# Patient Record
Sex: Female | Born: 1975
Health system: Southern US, Community
[De-identification: ages and names within clinical notes are randomized; demographics above are authoritative.]

## PROBLEM LIST (undated history)

## (undated) DIAGNOSIS — D219 Benign neoplasm of connective and other soft tissue, unspecified: Secondary | ICD-10-CM

## (undated) HISTORY — DX: Benign neoplasm of connective and other soft tissue, unspecified: D21.9

---

## 2015-08-02 ENCOUNTER — Ambulatory Visit (INDEPENDENT_AMBULATORY_CARE_PROVIDER_SITE_OTHER): Payer: 59 | Admitting: Obstetrics and Gynecology

## 2015-08-02 ENCOUNTER — Encounter: Payer: Self-pay | Admitting: Obstetrics and Gynecology

## 2015-08-02 VITALS — BP 96/58 | HR 80 | Resp 16 | Ht 61.5 in | Wt 134.0 lb

## 2015-08-02 DIAGNOSIS — R938 Abnormal findings on diagnostic imaging of other specified body structures: Secondary | ICD-10-CM | POA: Diagnosis not present

## 2015-08-02 DIAGNOSIS — N888 Other specified noninflammatory disorders of cervix uteri: Secondary | ICD-10-CM

## 2015-08-02 DIAGNOSIS — R9389 Abnormal findings on diagnostic imaging of other specified body structures: Secondary | ICD-10-CM

## 2015-08-02 DIAGNOSIS — N898 Other specified noninflammatory disorders of vagina: Secondary | ICD-10-CM | POA: Diagnosis not present

## 2015-08-02 DIAGNOSIS — R1909 Other intra-abdominal and pelvic swelling, mass and lump: Secondary | ICD-10-CM

## 2015-08-02 NOTE — Progress Notes (Signed)
Patient ID: Christine Mahoney, female   DOB: Jul 26, 1976, 39 y.o.   MRN: [id number] GYNECOLOGY  VISIT   HPI: 39 y.o.   Married  [place]  female   [phone] with Patient's last menstrual period was 07/08/2015.   here for for a follow up of a vaginal cyst the her former GYN found in 01/2015. The patient was incidentally noted to have a large vaginal cyst and an endocervical polyp in the spring of this year. She also had an ultrasound that showed a 1.6 cm endometrial stripe. Surgery was recommended. She moved and is getting f/u care here.  Menses q month over the last 10 months, previously about every 6 weeks. She bleeds x 7 days. 2-3 days are heavy. She saturates a super + tampon in 2-3 hours. Never anemic.  Sexually active, no pain, using condoms.   GYNECOLOGIC HISTORY: Patient's last menstrual period was 07/08/2015. Contraception:None Menopausal hormone therapy: N/A        OB History    Gravida Para Term Preterm AB TAB SAB Ectopic Multiple Living   2 2 2       2          There are no active problems to display for this patient.   Past Medical History  Diagnosis Date  . Fibroid     Past Surgical History  Procedure Laterality Date  . Cesarean section      Current Outpatient Prescriptions  Medication Sig Dispense Refill  . Multiple Vitamin (MULTIVITAMIN) capsule Take 1 capsule by mouth daily.     No current facility-administered medications for this visit.    SH: husband is an Materials engineer with [employer]. She runs a business. Sons are 3 and 5.  ALLERGIES: Azithromycin  History reviewed. No pertinent family history.  Social History   Social History  . Marital Status: Married    Spouse Name: N/A  . Number of Children: N/A  . Years of Education: N/A   Occupational History  . Not on file.   Social History Main Topics  . Smoking status: Never Smoker   . Smokeless tobacco: Never Used  . Alcohol Use: 0.6 oz/week    1 Standard drinks or equivalent per week  . Drug Use: No  .  Sexual Activity:    Partners: Male   Other Topics Concern  . Not on file   Social History Narrative  . No narrative on file    Review of Systems  Gastrointestinal: Negative for abdominal pain.  All other systems reviewed and are negative.   PHYSICAL EXAMINATION:    BP 96/58 mmHg  Pulse 80  Resp 16  Ht 5' 1.5" (1.562 m)  Wt 134 lb (60.782 kg)  BMI 24.91 kg/m2  LMP 07/08/2015    General appearance: alert, cooperative and appears stated age Neck: no adenopathy, supple, symmetrical, trachea midline and thyroid normal to inspection and palpation Abdomen: soft, non-tender; bowel sounds normal; no masses,  no organomegaly  Pelvic: External genitalia:  no lesions              Urethra:  normal appearing urethra with no masses, tenderness or lesions              Bartholins and Skenes: normal                 Vagina: normal appearing vagina with a 3 cm vaginal cyst in the apical portion of the left side of the vagina. Not tender.  Cervix: 1.5 cm protruding cervical polyp. Removed with ringed forceps. Upon grasping it decompressed some and on removal looked like a multicystic nabothian cyst.               Bimanual Exam:  Uterus:  slightly enlarged, anteverted, mobile, not tender uterus.               Adnexa: no mass, fullness, tenderness               Chaperone was present for exam.  ASSESSMENT Cervical mass Vaginal cyst, physiologic, not bothersome. No need for removal Heavy vaginal bleeding, not anemic Endometrial thickening    PLAN Cervical mass removed F/U for an ultrasound after her menses and possible sonohysterogram depending on her endometrial stripe   An After Visit Summary was printed and given to the patient.

## 2015-08-02 NOTE — Addendum Note (Signed)
Addended by: Dorothy Spark on: 08/02/2015 04:59 PM   Modules accepted: Orders

## 2015-08-07 ENCOUNTER — Telehealth: Payer: Self-pay | Admitting: Obstetrics and Gynecology

## 2015-08-07 NOTE — Telephone Encounter (Signed)
Spoke with patient regarding benefit for pelvic ultrasound. Patient agreeable and ready to schedule. Patient scheduled for 08/15/15 at 1230 with dr Talbert Nan. Patient has concerns regarding her cycle and the scheduled date of her pelvic ultrasound. Patient aware of cancellation policy and of modified holiday hours in office this week. Patient asking to speak with nurse for recommendations on scheduling. Patient also requesting recent biopsy/lab results. Patient agreeable to return call from nurse to discuss.

## 2015-08-07 NOTE — Telephone Encounter (Signed)
Please change her appointment to the end of her cycle.

## 2015-08-07 NOTE — Telephone Encounter (Signed)
Spoke with patient. Patient is concerned she will be on her cycle at the time she is scheduled for her PUS on 11/29. Advised it is okay if she is on her cycle the day of the ultrasound. We are able to perform ultrasounds while patients are on their cycle. If she feels uncomfortable having this done with her cycle I can reschedule. Patient feels she will be okay to keep the appointment as it is scheduled. Advised patient that her recent biopsy results have not returned yet, but as soon as they do and are reviewed by Dr.Jertson we will give her a call to discuss the results. Patient is agreeable.   Routing to provider for final review. Patient agreeable to disposition. Will close encounter.

## 2015-08-07 NOTE — Telephone Encounter (Signed)
Spoke with patient. Advised of message as seen below from [place]. Patient is agreeable and verbalizes understanding. Appointment moved to 08/17/2015 at 9:30 am with 10 am consult with Dr.Jertson. Patient is agreeable to date and time. Will return call Monday if her cycle is ending to move her appointment back to 11/29.  Routing to Dr.Jertson as FYI. Encounter previously closed.

## 2015-08-08 ENCOUNTER — Telehealth: Payer: Self-pay

## 2015-08-08 NOTE — Telephone Encounter (Signed)
Spoke with patient. Results given as seen below from [place]. Patient is agreeable and verbalizes understanding.  Routing to provider for final review. Patient agreeable to disposition. Will close encounter.

## 2015-08-08 NOTE — Telephone Encounter (Signed)
-----   Message from Salvadore Dom, MD sent at 08/07/2015  3:53 PM EST ----- Please inform the patient that it returned as a benign endocervical polyp.

## 2015-08-14 ENCOUNTER — Telehealth: Payer: Self-pay | Admitting: Obstetrics and Gynecology

## 2015-08-14 NOTE — Telephone Encounter (Signed)
Patient has a question about her period ending and the ultrasound appointment scheduled on Thursday.

## 2015-08-14 NOTE — Telephone Encounter (Signed)
Spoke with patient. Advised of message as seen below from [place]. Patient is agreeable. Will keep appointment for 08/17/2015.  Routing to provider for final review. Patient agreeable to disposition. Will close encounter.

## 2015-08-14 NOTE — Telephone Encounter (Signed)
Spoke with patient. Patient states that she feels her cycle is about to end. Cycle started on 11/24. "I know the ultrasound needs to be done at the end of my cycle. I want to make sure I get the best results." Patient states that her bleeding is light today and feels it will be very light tomorrow. Offered to move PUS to tomorrow 11/29. Patient is unavailable at the times offered. Patient prefers to keep her PUS for Thursday. Advised this should be okay as she feels her cycle will end tomorrow or Wednesday. Advised I will check with Dr.Jertson and return call. Patient is agreeable.  Dr.Jertson, Okay to keep as scheduled?

## 2015-08-14 NOTE — Telephone Encounter (Signed)
If she is ending her cycle now, Thursday should be fine. Thanks

## 2015-08-15 ENCOUNTER — Encounter: Payer: Self-pay | Admitting: Obstetrics and Gynecology

## 2015-08-15 ENCOUNTER — Other Ambulatory Visit: Payer: 59 | Admitting: Obstetrics and Gynecology

## 2015-08-15 ENCOUNTER — Other Ambulatory Visit: Payer: 59

## 2015-08-17 ENCOUNTER — Encounter: Payer: Self-pay | Admitting: Obstetrics and Gynecology

## 2015-08-17 ENCOUNTER — Ambulatory Visit (INDEPENDENT_AMBULATORY_CARE_PROVIDER_SITE_OTHER): Payer: 59

## 2015-08-17 ENCOUNTER — Ambulatory Visit (INDEPENDENT_AMBULATORY_CARE_PROVIDER_SITE_OTHER): Payer: 59 | Admitting: Obstetrics and Gynecology

## 2015-08-17 VITALS — BP 98/60 | HR 64 | Resp 14 | Wt 133.0 lb

## 2015-08-17 DIAGNOSIS — R938 Abnormal findings on diagnostic imaging of other specified body structures: Secondary | ICD-10-CM | POA: Diagnosis not present

## 2015-08-17 DIAGNOSIS — R9389 Abnormal findings on diagnostic imaging of other specified body structures: Secondary | ICD-10-CM

## 2015-08-17 NOTE — Progress Notes (Signed)
       GYNECOLOGY  VISIT   HPI: 39 y.o.   Married  [place]  female   [phone] with Patient's last menstrual period was 08/10/2015.   here for Pelvic U/S. [place] she was told her endometrial lining was very thick and she needed a hysteroscopy. She reports normal cycles. Today she presents for an ultrasound to evaluate her lining after her cycle.  At her last visit a large benign cervical polyp was removed.   GYNECOLOGIC HISTORY: Patient's last menstrual period was 08/10/2015. Contraception:none Menopausal hormone therapy: N/A        OB History    Gravida Para Term Preterm AB TAB SAB Ectopic Multiple Living   2 2 2       2          There are no active problems to display for this patient.   Past Medical History  Diagnosis Date  . Fibroid     Past Surgical History  Procedure Laterality Date  . Cesarean section      Current Outpatient Prescriptions  Medication Sig Dispense Refill  . Multiple Vitamin (MULTIVITAMIN) capsule Take 1 capsule by mouth daily.     No current facility-administered medications for this visit.     ALLERGIES: Azithromycin  History reviewed. No pertinent family history.  Social History   Social History  . Marital Status: Married    Spouse Name: N/A  . Number of Children: N/A  . Years of Education: N/A   Occupational History  . Not on file.   Social History Main Topics  . Smoking status: Never Smoker   . Smokeless tobacco: Never Used  . Alcohol Use: 0.6 oz/week    1 Standard drinks or equivalent per week  . Drug Use: No  . Sexual Activity:    Partners: Male   Other Topics Concern  . Not on file   Social History Narrative    Review of Systems  All other systems reviewed and are negative.   PHYSICAL EXAMINATION:    BP 98/60 mmHg  Pulse 64  Resp 14  Wt 133 lb (60.328 kg)  LMP 08/10/2015    General appearance: alert, cooperative and appears stated age  Ultrasound pictures were reviewed with the  patient, her lining is completely normal on today's ultrasound. Few small myomas noted.   ASSESSMENT Normal appearing endometrium    PLAN F/U for routine care   An After Visit Summary was printed and given to the patient.

## 2015-10-06 DIAGNOSIS — M545 Low back pain: Secondary | ICD-10-CM | POA: Diagnosis not present

## 2015-10-10 DIAGNOSIS — M545 Low back pain: Secondary | ICD-10-CM | POA: Diagnosis not present

## 2015-10-12 DIAGNOSIS — M545 Low back pain: Secondary | ICD-10-CM | POA: Diagnosis not present

## 2015-10-17 DIAGNOSIS — M545 Low back pain: Secondary | ICD-10-CM | POA: Diagnosis not present

## 2016-01-22 DIAGNOSIS — M533 Sacrococcygeal disorders, not elsewhere classified: Secondary | ICD-10-CM | POA: Diagnosis not present

## 2016-01-26 DIAGNOSIS — M533 Sacrococcygeal disorders, not elsewhere classified: Secondary | ICD-10-CM | POA: Diagnosis not present

## 2016-02-06 DIAGNOSIS — M533 Sacrococcygeal disorders, not elsewhere classified: Secondary | ICD-10-CM | POA: Diagnosis not present

## 2016-02-20 DIAGNOSIS — M533 Sacrococcygeal disorders, not elsewhere classified: Secondary | ICD-10-CM | POA: Diagnosis not present

## 2016-02-26 DIAGNOSIS — M9903 Segmental and somatic dysfunction of lumbar region: Secondary | ICD-10-CM | POA: Diagnosis not present

## 2016-02-26 DIAGNOSIS — M9902 Segmental and somatic dysfunction of thoracic region: Secondary | ICD-10-CM | POA: Diagnosis not present

## 2016-11-12 ENCOUNTER — Telehealth: Payer: Self-pay | Admitting: Obstetrics and Gynecology

## 2016-11-20 ENCOUNTER — Encounter: Payer: Self-pay | Admitting: Obstetrics and Gynecology

## 2016-11-20 ENCOUNTER — Ambulatory Visit (INDEPENDENT_AMBULATORY_CARE_PROVIDER_SITE_OTHER): Payer: 59 | Admitting: Obstetrics and Gynecology

## 2016-11-20 VITALS — BP 92/60 | HR 80 | Resp 16 | Ht 61.25 in | Wt 140.0 lb

## 2016-11-20 DIAGNOSIS — N898 Other specified noninflammatory disorders of vagina: Secondary | ICD-10-CM | POA: Diagnosis not present

## 2016-11-20 DIAGNOSIS — Z01419 Encounter for gynecological examination (general) (routine) without abnormal findings: Secondary | ICD-10-CM | POA: Diagnosis not present

## 2016-11-20 DIAGNOSIS — N920 Excessive and frequent menstruation with regular cycle: Secondary | ICD-10-CM

## 2016-11-20 DIAGNOSIS — Z124 Encounter for screening for malignant neoplasm of cervix: Secondary | ICD-10-CM

## 2016-11-20 DIAGNOSIS — R6882 Decreased libido: Secondary | ICD-10-CM

## 2016-11-20 DIAGNOSIS — N923 Ovulation bleeding: Secondary | ICD-10-CM

## 2016-11-20 DIAGNOSIS — Z1151 Encounter for screening for human papillomavirus (HPV): Secondary | ICD-10-CM | POA: Diagnosis not present

## 2016-11-20 DIAGNOSIS — Z0000 Encounter for general adult medical examination without abnormal findings: Secondary | ICD-10-CM

## 2016-11-20 LAB — COMPREHENSIVE METABOLIC PANEL
ALBUMIN: 4.4 g/dL (ref 3.6–5.1)
ALK PHOS: 79 U/L (ref 33–115)
ALT: 18 U/L (ref 6–29)
AST: 18 U/L (ref 10–30)
BUN: 14 mg/dL (ref 7–25)
CHLORIDE: 103 mmol/L (ref 98–110)
CO2: 27 mmol/L (ref 20–31)
Calcium: 9.7 mg/dL (ref 8.6–10.2)
Creat: 0.62 mg/dL (ref 0.50–1.10)
Glucose, Bld: 86 mg/dL (ref 65–99)
POTASSIUM: 4.6 mmol/L (ref 3.5–5.3)
Sodium: 138 mmol/L (ref 135–146)
TOTAL PROTEIN: 7.2 g/dL (ref 6.1–8.1)
Total Bilirubin: 0.3 mg/dL (ref 0.2–1.2)

## 2016-11-20 LAB — CBC
HEMATOCRIT: 40 % (ref 35.0–45.0)
HEMOGLOBIN: 13.2 g/dL (ref 11.7–15.5)
MCH: 29.3 pg (ref 27.0–33.0)
MCHC: 33 g/dL (ref 32.0–36.0)
MCV: 88.9 fL (ref 80.0–100.0)
MPV: 10 fL (ref 7.5–12.5)
Platelets: 349 10*3/uL (ref 140–400)
RBC: 4.5 MIL/uL (ref 3.80–5.10)
RDW: 12.9 % (ref 11.0–15.0)
WBC: 7.6 10*3/uL (ref 3.8–10.8)

## 2016-11-20 LAB — LIPID PANEL
CHOL/HDL RATIO: 3 ratio (ref <5.0)
CHOLESTEROL: 188 mg/dL (ref <200)
HDL: 63 mg/dL (ref >50)
LDL Cholesterol: 97 mg/dL (ref <100)
TRIGLYCERIDES: 142 mg/dL (ref <150)
VLDL: 28 mg/dL (ref <30)

## 2016-11-20 NOTE — Progress Notes (Signed)
41 y.o. G72P2002 Married [place] F here for annual exam.  For the last 4 months, she is having brown d/c mid cycle for about 4-5 days. No real blood. She notices the brown d/c with a thick mucous d/c.  Period Cycle (Days): 28 Period Duration (Days): 6-8 days  Period Pattern: Regular Menstrual Flow: Moderate, Heavy Menstrual Control: Maxi pad, Tampon Menstrual Control Change Freq (Hours): changes pad every 8 hours  Dysmenorrhea: None  Changing a pad in 3 hours at time. No other bleeding, no postcoital bleeding. No dyspareunia.  She c/o vaginal itching for the last week, mild. D/C started a few days ago, brown.  She c/o a low sex drive, they are both very busy.   Patient's last menstrual period was 11/03/2016.          Sexually active: Yes.    The current method of family planning is condoms most of the time.    Exercising: Yes.    Cardio/ light strength training  Smoker:  no  Health Maintenance: Pap: 02/2015 WNL per patient  History of abnormal Pap:  no MMG:  Never Colonoscopy:  Never BMD:   Never TDaP:  Unsure, thinks UTD Gardasil: N/A   reports that she has never smoked. She has never used smokeless tobacco. 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 They have 2 young sons (55 and 7).   Past Medical History:  Diagnosis Date  . Fibroid     Past Surgical History:  Procedure Laterality Date  . CESAREAN SECTION    x 2  Current Outpatient Prescriptions  Medication Sig Dispense Refill  . Multiple Vitamin (MULTIVITAMIN) capsule Take 1 capsule by mouth daily.     No current facility-administered medications for this visit.     No family history on file.  Review of Systems  Constitutional: Negative.   HENT: Negative.   Eyes: Negative.   Respiratory: Negative.   Cardiovascular: Negative.   Gastrointestinal: Negative.   Endocrine: Negative.   Genitourinary: Positive for vaginal discharge.       Loss of  sexual interest vaginal itching   Musculoskeletal: Negative.   Skin: Negative.   Allergic/Immunologic: Negative.   Neurological: Negative.   Psychiatric/Behavioral: Negative.     Exam:   BP 92/60 (BP Location: Right Arm, Patient Position: Sitting, Cuff Size: Normal)   Pulse 80   Resp 16   Ht 5' 1.25" (1.556 m)   Wt 140 lb (63.5 kg)   LMP 11/03/2016   BMI 26.24 kg/m   Weight change: @WEIGHTCHANGE @ Height:   Height: 5' 1.25" (155.6 cm)  Ht Readings from Last 3 Encounters:  11/20/16 5' 1.25" (1.556 m)  08/02/15 5' 1.5" (1.562 m)    General appearance: alert, cooperative and appears stated age Head: Normocephalic, without obvious abnormality, atraumatic Neck: no adenopathy, supple, symmetrical, trachea midline and thyroid normal to inspection and palpation Lungs: clear to auscultation bilaterally Cardiovascular: regular rate and rhythm Breasts: normal appearance, no masses or tenderness Heart: regular rate and rhythm Abdomen: soft, non-tender; bowel sounds normal; no masses,  no organomegaly Extremities: extremities normal, atraumatic, no cyanosis or edema Skin: Skin color, texture, turgor normal. No rashes or lesions Lymph nodes: Cervical, supraclavicular, and axillary nodes normal. No abnormal inguinal nodes palpated Neurologic: Grossly normal   Pelvic: External genitalia:  no lesions              Urethra:  normal appearing urethra with no masses, tenderness  or lesions              Bartholins and Skenes: normal                 Vagina: normal appearing vagina with normal color and a slight increase in thick white vaginal discharge, no lesions              Cervix: no lesions               Bimanual Exam:  Uterus:  normal size, contour, position, consistency, mobility, non-tender and anteverted              Adnexa: no mass, fullness, tenderness               Rectovaginal: Confirms               Anus:  normal sphincter tone, no lesions  Chaperone was present for exam.  A:   Well Woman with normal exam  Low libido  Mid cycle spotting  Vaginal discharge  P:   Pap with hpv  Screening labs   Discussed breast self exam  Discussed calcium and vit D intake  Mammogram  Discussed libido, information given  Calendar cycles, send in for review  Wet prep probe

## 2016-11-20 NOTE — Patient Instructions (Signed)
EXERCISE AND DIET:  We recommended that you start or continue a regular exercise program for good health. Regular exercise means any activity that makes your heart beat faster and makes you sweat.  We recommend exercising at least 30 minutes per day at least 3 days a week, preferably 4 or 5.  We also recommend a diet low in fat and sugar.  Inactivity, poor dietary choices and obesity can cause diabetes, heart attack, stroke, and kidney damage, among others.    ALCOHOL AND SMOKING:  Women should limit their alcohol intake to no more than 7 drinks/beers/glasses of wine (combined, not each!) per week. Moderation of alcohol intake to this level decreases your risk of breast cancer and liver damage. And of course, no recreational drugs are part of a healthy lifestyle.  And absolutely no smoking or even second hand smoke. Most people know smoking can cause heart and lung diseases, but did you know it also contributes to weakening of your bones? Aging of your skin?  Yellowing of your teeth and nails?  CALCIUM AND VITAMIN D:  Adequate intake of calcium and Vitamin D are recommended.  The recommendations for exact amounts of these supplements seem to change often, but generally speaking 600 mg of calcium (either carbonate or citrate) and 800 units of Vitamin D per day seems prudent. Certain women may benefit from higher intake of Vitamin D.  If you are among these women, your doctor will have told you during your visit.    PAP SMEARS:  Pap smears, to check for cervical cancer or precancers,  have traditionally been done yearly, although recent scientific advances have shown that most women can have pap smears less often.  However, every woman still should have a physical exam from her gynecologist every year. It will include a breast check, inspection of the vulva and vagina to check for abnormal growths or skin changes, a visual exam of the cervix, and then an exam to evaluate the size and shape of the uterus and  ovaries.  And after 40 years of age, a rectal exam is indicated to check for rectal cancers. We will also provide age appropriate advice regarding health maintenance, like when you should have certain vaccines, screening for sexually transmitted diseases, bone density testing, colonoscopy, mammograms, etc.   MAMMOGRAMS:  All women over 40 years old should have a yearly mammogram. Many facilities now offer a "3D" mammogram, which may cost around $50 extra out of pocket. If possible,  we recommend you accept the option to have the 3D mammogram performed.  It both reduces the number of women who will be called back for extra views which then turn out to be normal, and it is better than the routine mammogram at detecting truly abnormal areas.    COLONOSCOPY:  Colonoscopy to screen for colon cancer is recommended for all women at age 50.  We know, you hate the idea of the prep.  We agree, BUT, having colon cancer and not knowing it is worse!!  Colon cancer so often starts as a polyp that can be seen and removed at colonscopy, which can quite literally save your life!  And if your first colonoscopy is normal and you have no family history of colon cancer, most women don't have to have it again for 10 years.  Once every ten years, you can do something that may end up saving your life, right?  We will be happy to help you get it scheduled when you are ready.    Be sure to check your insurance coverage so you understand how much it will cost.  It may be covered as a preventative service at no cost, but you should check your particular policy.      Breast Self-Awareness Breast self-awareness means:  Knowing how your breasts look.  Knowing how your breasts feel.  Checking your breasts every month for changes.  Telling your doctor if you notice a change in your breasts. Breast self-awareness allows you to notice a breast problem early while it is still small. How to do a breast self-exam One way to learn what  is normal for your breasts and to check for changes is to do a breast self-exam. To do a breast self-exam: Look for Changes   1. Take off all the clothes above your waist. 2. Stand in front of a mirror in a room with good lighting. 3. Put your hands on your hips. 4. Push your hands down. 5. Look at your breasts and nipples in the mirror to see if one breast or nipple looks different than the other. Check to see if:  The shape of one breast is different.  The size of one breast is different.  There are wrinkles, dips, and bumps in one breast and not the other. 6. Look at each breast for changes in your skin, such as:  Redness.  Scaly areas. 7. Look for changes in your nipples, such as:  Liquid around the nipples.  Bleeding.  Dimpling.  Redness.  A change in where the nipples are. Feel for Changes  1. Lie on your back on the floor. 2. Feel each breast. To do this, follow these steps:  Pick a breast to feel.  Put the arm closest to that breast above your head.  Use your other arm to feel the nipple area of your breast. Feel the area with the pads of your three middle fingers by making small circles with your fingers. For the first circle, press lightly. For the second circle, press harder. For the third circle, press even harder.  Keep making circles with your fingers at the light, harder, and even harder pressures as you move down your breast. Stop when you feel your ribs.  Move your fingers a little toward the center of your body.  Start making circles with your fingers again, this time going up until you reach your collarbone.  Keep making up and down circles until you reach your armpit. Remember to keep using the three pressures.  Feel the other breast in the same way. 3. Sit or stand in the shower or tub. 4. With soapy water on your skin, feel each breast the same way you did in step 2, when you were lying on the floor. Write Down What You Find   After doing the  self-exam, write down:  What is normal for each breast.  Any changes you find in each breast.  When you last had your period. How often should I check my breasts? Check your breasts every month. If you are breastfeeding, the best time to check them is after you feed your baby or after you use a breast pump. If you get periods, the best time to check your breasts is 5-7 days after your period is over. When should I see my doctor? See your doctor if you notice:  A change in shape or size of your breasts or nipples.  A change in the skin of your breast or nipples, such as red or  scaly skin.  Unusual fluid coming from your nipples.  A lump or thick area that was not there before.  Pain in your breasts.  Anything that concerns you. This information is not intended to replace advice given to you by your health care provider. Make sure you discuss any questions you have with your health care provider. Document Released: 02/19/2008 Document Revised: 02/08/2016 Document Reviewed: 07/23/2015 Elsevier Interactive Patient Education  2017 [employer].

## 2016-11-21 LAB — VITAMIN D 25 HYDROXY (VIT D DEFICIENCY, FRACTURES): Vit D, 25-Hydroxy: 29 ng/mL — ABNORMAL LOW (ref 30–100)

## 2016-11-21 LAB — WET PREP BY MOLECULAR PROBE
Candida species: NOT DETECTED
Gardnerella vaginalis: NOT DETECTED
Trichomonas vaginosis: NOT DETECTED

## 2016-11-22 LAB — IPS PAP TEST WITH HPV

## 2016-12-18 NOTE — Telephone Encounter (Signed)
Ok to close

## 2016-12-26 ENCOUNTER — Ambulatory Visit (INDEPENDENT_AMBULATORY_CARE_PROVIDER_SITE_OTHER): Payer: 59 | Admitting: Physician Assistant

## 2016-12-26 ENCOUNTER — Encounter: Payer: Self-pay | Admitting: Physician Assistant

## 2016-12-26 DIAGNOSIS — N3090 Cystitis, unspecified without hematuria: Secondary | ICD-10-CM

## 2016-12-26 DIAGNOSIS — R300 Dysuria: Secondary | ICD-10-CM | POA: Diagnosis not present

## 2016-12-26 LAB — POC URINALSYSI DIPSTICK (AUTOMATED)
Bilirubin, UA: NEGATIVE
Blood, UA: NEGATIVE
Glucose, UA: NEGATIVE
Ketones, UA: 5
Nitrite, UA: NEGATIVE
PROTEIN UA: NEGATIVE
Spec Grav, UA: >=1.03 — AB (ref 1.010–1.025)
Urobilinogen, UA: 0.2 E.U./dL
pH, UA: 6 (ref 5.0–8.0)

## 2016-12-26 LAB — POCT URINE PREGNANCY: PREG TEST UR: NEGATIVE

## 2016-12-26 MED ORDER — NITROFURANTOIN MONOHYD MACRO 100 MG PO CAPS: 100.0000 mg | ORAL_CAPSULE | Freq: Two times a day (BID) | ORAL | 0 refills | Status: DC

## 2016-12-26 NOTE — Progress Notes (Signed)
Pre visit review using our clinic review tool, if applicable. No additional management support is needed unless otherwise documented below in the visit note. 

## 2016-12-26 NOTE — Addendum Note (Signed)
Addended by: Erlene Quan on: 12/26/2016 02:25 PM   Modules accepted: Level of Service

## 2016-12-26 NOTE — Progress Notes (Signed)
Christine Mahoney is a 40 y.o. female here for a new problem.  Jari Sportsman, CMA acted as a Education administrator for [employer], [place].  History of Present Illness:   Chief Complaint  Patient presents with  . [employer] Employee  . Dysuria    Sx have been intermittent x1 month now. Denies any past HX of UTIs. No urinary frequency , fevers, or abdominal pain.    Acute Concerns: Dysuria -- painful urination intermittently x 1 month, would self treat with cranberry pills, pees when she urinates, no history of urinary infection or kidney infection, had a kidney stone when she was in college, does okay with staying hydrated throughout, no fever/chills/nausea, no changes in bowels, March 20th was last period, they are very regular, no back or side pain, no concern for STI  Chronic Issues: None  Health Maintenance: Immunizations -- UTD Colonoscopy -- no family hx of colon cancer, start at age 32 Mammogram -- is considering to get this done PAP -- last PAP smear  Bone Density -- none Weight -- Weight: 141 lb 12.8 oz (64.3 kg)  Mood -- no issues with depression or anxiety  Depression screen PHQ 2/9 12/26/2016  Decreased Interest 0  Down, Depressed, Hopeless 0  PHQ - 2 Score 0    Other providers/specialists: [facility] - Ob-Gyn   PMHx, SurgHx, SocialHx, Medications, and Allergies were reviewed in the Visit Navigator and updated as appropriate.  Current Medications:   Current Outpatient Prescriptions:  Marland Kitchen  Multiple Vitamin (MULTIVITAMIN) capsule, Take 1 capsule by mouth daily., Disp: , Rfl:  .  nitrofurantoin, macrocrystal-monohydrate, (MACROBID) 100 MG capsule, Take 1 capsule (100 mg total) by mouth 2 (two) times daily., Disp: 10 capsule, Rfl: 0   Review of Systems:   Review of Systems  Constitutional: Negative.   HENT: Negative.   Eyes: Negative.   Respiratory: Negative.   Cardiovascular: Negative.   Genitourinary: Positive for dysuria and urgency.  Musculoskeletal:  Negative.   Skin: Negative.   Neurological: Negative.   Endo/Heme/Allergies: Negative.   Psychiatric/Behavioral: Negative.     Vitals:   Vitals:   12/26/16 1333  BP: 100/68  Pulse: 82  SpO2: 98%  Weight: 141 lb 12.8 oz (64.3 kg)  Height: 5' 1.25" (1.556 m)     Body mass index is 26.57 kg/m.  Physical Exam:   Physical Exam  Constitutional: She appears well-developed. She is cooperative.  Non-toxic appearance. She does not have a sickly appearance. She does not appear ill. No distress.  Cardiovascular: Normal rate, regular rhythm, S1 normal, S2 normal, normal heart sounds and normal pulses.   No LE edema  Pulmonary/Chest: Effort normal and breath sounds normal.  Abdominal: There is no tenderness. There is CVA tenderness.  No suprapubic tenderness  Neurological: She is alert.  Nursing note and vitals reviewed.   Results for orders placed or performed in visit on 12/26/16  POCT Urinalysis Dipstick (Automated)  Result Value Ref Range   Color, UA Yellow    Clarity, UA Clear    Glucose, UA Negative    Bilirubin, UA Negative    Ketones, UA 5    Spec Grav, UA >=1.030 (A) 1.010 - 1.025   Blood, UA Negative    pH, UA 6.0 5.0 - 8.0   Protein, UA Negative    Urobilinogen, UA 0.2 0.2 or 1.0 E.U./dL   Nitrite, UA Negative    Leukocytes, UA Trace (A) Negative  POCT urine pregnancy  Result Value Ref  Range   Preg Test, Ur Negative Negative     Assessment and Plan:    Semira was seen today for establish care and dysuria.  Diagnoses and all orders for this visit:  Dysuria -     POCT Urinalysis Dipstick (Automated) -     POCT urine pregnancy -     Urine culture; Future -     Urine culture  Other orders -     nitrofurantoin, macrocrystal-monohydrate, (MACROBID) 100 MG capsule; Take 1 capsule (100 mg total) by mouth 2 (two) times daily.   Given duration of symptoms, will treat acute cystitis with Macrobid 100 mg BID. Advised patient that if she develops any worsening  symptoms I would like for her to let us know, especially if she develops fevers, chills, poor appetite, or back pain. We will send her urine out for culture.  . Reviewed expectations re: course of current medical issues. . Discussed self-management of symptoms. . Outlined signs and symptoms indicating need for more acute intervention. . Patient verbalized understanding and all questions were answered. . See orders for this visit as documented in the electronic medical record. . Patient received an After-Visit Summary.  CMA or LPN served as scribe during this visit. History, Physical, and Plan performed by medical provider. Documentation and orders reviewed and attested to.  Inda Coke, PA-C

## 2016-12-26 NOTE — Patient Instructions (Addendum)
It was great meeting you today!  I have sent in [place] for you to take.  If you develop any worsening fever, chills or back pain, let us know.   Urinary Tract Infection, Adult A urinary tract infection (UTI) is an infection of any part of the urinary tract, which includes the kidneys, ureters, bladder, and urethra. These organs make, store, and get rid of urine in the body. UTI can be a bladder infection (cystitis) or kidney infection (pyelonephritis). What are the causes? This infection may be caused by fungi, viruses, or bacteria. Bacteria are the most common cause of UTIs. This condition can also be caused by repeated incomplete emptying of the bladder during urination. What increases the risk? This condition is more likely to develop if:  You ignore your need to urinate or hold urine for long periods of time.  You do not empty your bladder completely during urination.  You wipe back to front after urinating or having a bowel movement, if you are female.  You are uncircumcised, if you are female.  You are constipated.  You have a urinary catheter that stays in place (indwelling).  You have a weak defense (immune) system.  You have a medical condition that affects your bowels, kidneys, or bladder.  You have diabetes.  You take antibiotic medicines frequently or for long periods of time, and the antibiotics no longer work well against certain types of infections (antibiotic resistance).  You take medicines that irritate your urinary tract.  You are exposed to chemicals that irritate your urinary tract.  You are female. What are the signs or symptoms? Symptoms of this condition include:  Fever.  Frequent urination or passing small amounts of urine frequently.  Needing to urinate urgently.  Pain or burning with urination.  Urine that smells bad or unusual.  Cloudy urine.  Pain in the lower abdomen or back.  Trouble urinating.  Blood in the urine.  Vomiting  or being less hungry than normal.  Diarrhea or abdominal pain.  Vaginal discharge, if you are female. How is this diagnosed? This condition is diagnosed with a medical history and physical exam. You will also need to provide a urine sample to test your urine. Other tests may be done, including:  Blood tests.  Sexually transmitted disease (STD) testing. If you have had more than one UTI, a cystoscopy or imaging studies may be done to determine the cause of the infections. How is this treated? Treatment for this condition often includes a combination of two or more of the following:  Antibiotic medicine.  Other medicines to treat less common causes of UTI.  Over-the-counter medicines to treat pain.  Drinking enough water to stay hydrated. Follow these instructions at home:  Take over-the-counter and prescription medicines only as told by your health care provider.  If you were prescribed an antibiotic, take it as told by your health care provider. Do not stop taking the antibiotic even if you start to feel better.  Avoid alcohol, caffeine, tea, and carbonated beverages. They can irritate your bladder.  Drink enough fluid to keep your urine clear or pale yellow.  Keep all follow-up visits as told by your health care provider. This is important.  Make sure to:  Empty your bladder often and completely. Do not hold urine for long periods of time.  Empty your bladder before and after sex.  Wipe from front to back after a bowel movement if you are female. Use each tissue one time when you  wipe. Contact a health care provider if:  You have back pain.  You have a fever.  You feel nauseous or vomit.  Your symptoms do not get better after 3 days.  Your symptoms go away and then return. Get help right away if:  You have severe back pain or lower abdominal pain.  You are vomiting and cannot keep down any medicines or water. This information is not intended to replace advice  given to you by your health care provider. Make sure you discuss any questions you have with your health care provider. Document Released: 06/12/2005 Document Revised: 02/14/2016 Document Reviewed: 07/24/2015 Elsevier Interactive Patient Education  2017 [employer].

## 2016-12-27 LAB — URINE CULTURE

## 2016-12-30 ENCOUNTER — Other Ambulatory Visit: Payer: Self-pay | Admitting: Physician Assistant

## 2016-12-30 MED ORDER — AMOXICILLIN 500 MG PO CAPS: 500.0000 mg | ORAL_CAPSULE | Freq: Two times a day (BID) | ORAL | 0 refills | Status: AC

## 2017-07-29 DIAGNOSIS — M9903 Segmental and somatic dysfunction of lumbar region: Secondary | ICD-10-CM | POA: Diagnosis not present

## 2017-07-29 DIAGNOSIS — M9902 Segmental and somatic dysfunction of thoracic region: Secondary | ICD-10-CM | POA: Diagnosis not present

## 2017-07-29 DIAGNOSIS — M5431 Sciatica, right side: Secondary | ICD-10-CM | POA: Diagnosis not present

## 2017-07-29 DIAGNOSIS — M9905 Segmental and somatic dysfunction of pelvic region: Secondary | ICD-10-CM | POA: Diagnosis not present

## 2017-11-26 ENCOUNTER — Ambulatory Visit: Payer: 59 | Admitting: Obstetrics and Gynecology

## 2017-11-26 ENCOUNTER — Other Ambulatory Visit: Payer: Self-pay

## 2017-11-26 ENCOUNTER — Encounter: Payer: Self-pay | Admitting: Obstetrics and Gynecology

## 2017-11-26 VITALS — BP 110/54 | HR 72 | Resp 14 | Ht 61.5 in | Wt 142.5 lb

## 2017-11-26 DIAGNOSIS — Z789 Other specified health status: Secondary | ICD-10-CM

## 2017-11-26 DIAGNOSIS — R5383 Other fatigue: Secondary | ICD-10-CM | POA: Diagnosis not present

## 2017-11-26 DIAGNOSIS — Z0000 Encounter for general adult medical examination without abnormal findings: Secondary | ICD-10-CM

## 2017-11-26 DIAGNOSIS — Z01419 Encounter for gynecological examination (general) (routine) without abnormal findings: Secondary | ICD-10-CM

## 2017-11-26 DIAGNOSIS — E559 Vitamin D deficiency, unspecified: Secondary | ICD-10-CM | POA: Diagnosis not present

## 2017-11-26 NOTE — Patient Instructions (Addendum)
EXERCISE AND DIET:  We recommended that you start or continue a regular exercise program for good health. Regular exercise means any activity that makes your heart beat faster and makes you sweat.  We recommend exercising at least 30 minutes per day at least 3 days a week, preferably 4 or 5.  We also recommend a diet low in fat and sugar.  Inactivity, poor dietary choices and obesity can cause diabetes, heart attack, stroke, and kidney damage, among others.    ALCOHOL AND SMOKING:  Women should limit their alcohol intake to no more than 7 drinks/beers/glasses of wine (combined, not each!) per week. Moderation of alcohol intake to this level decreases your risk of breast cancer and liver damage. And of course, no recreational drugs are part of a healthy lifestyle.  And absolutely no smoking or even second hand smoke. Most people know smoking can cause heart and lung diseases, but did you know it also contributes to weakening of your bones? Aging of your skin?  Yellowing of your teeth and nails?  CALCIUM AND VITAMIN D:  Adequate intake of calcium and Vitamin D are recommended.  The recommendations for exact amounts of these supplements seem to change often, but generally speaking 600 mg of calcium (either carbonate or citrate) and 800 units of Vitamin D per day seems prudent. Certain women may benefit from higher intake of Vitamin D.  If you are among these women, your doctor will have told you during your visit.    PAP SMEARS:  Pap smears, to check for cervical cancer or precancers,  have traditionally been done yearly, although recent scientific advances have shown that most women can have pap smears less often.  However, every woman still should have a physical exam from her gynecologist every year. It will include a breast check, inspection of the vulva and vagina to check for abnormal growths or skin changes, a visual exam of the cervix, and then an exam to evaluate the size and shape of the uterus and  ovaries.  And after 42 years of age, a rectal exam is indicated to check for rectal cancers. We will also provide age appropriate advice regarding health maintenance, like when you should have certain vaccines, screening for sexually transmitted diseases, bone density testing, colonoscopy, mammograms, etc.   MAMMOGRAMS:  All women over 40 years old should have a yearly mammogram. Many facilities now offer a "3D" mammogram, which may cost around $50 extra out of pocket. If possible,  we recommend you accept the option to have the 3D mammogram performed.  It both reduces the number of women who will be called back for extra views which then turn out to be normal, and it is better than the routine mammogram at detecting truly abnormal areas.    COLONOSCOPY:  Colonoscopy to screen for colon cancer is recommended for all women at age 50.  We know, you hate the idea of the prep.  We agree, BUT, having colon cancer and not knowing it is worse!!  Colon cancer so often starts as a polyp that can be seen and removed at colonscopy, which can quite literally save your life!  And if your first colonoscopy is normal and you have no family history of colon cancer, most women don't have to have it again for 10 years.  Once every ten years, you can do something that may end up saving your life, right?  We will be happy to help you get it scheduled when you are ready.    Be sure to check your insurance coverage so you understand how much it will cost.  It may be covered as a preventative service at no cost, but you should check your particular policy.      Mammogram A mammogram is an X-ray of the breasts that is done to check for abnormal changes. This procedure can screen for and detect any changes that may suggest breast cancer. A mammogram can also identify other changes and variations in the breast, such as:  Inflammation of the breast tissue (mastitis).  An infected area that contains a collection of pus  (abscess).  A fluid-filled sac (cyst).  Fibrocystic changes. This is when breast tissue becomes denser, which can make the tissue feel rope-like or uneven under the skin.  Tumors that are not cancerous (benign).  Tell a health care provider about:  Any allergies you have.  If you have breast implants.  If you have had previous breast disease, biopsy, or surgery.  If you are breastfeeding.  Any possibility that you could be pregnant, if this applies.  If you are younger than age 28.  If you have a family history of breast cancer. What are the risks? Generally, this is a safe procedure. However, problems may occur, including:  Exposure to radiation. Radiation levels are very low with this test.  The results being misinterpreted.  The need for further tests.  The inability of the mammogram to detect certain cancers.  What happens before the procedure?  Schedule your test about 1-2 weeks after your menstrual period. This is usually when your breasts are the least tender.  If you have had a mammogram done at a different facility in the past, get the mammogram X-rays or have them sent to your current exam facility in order to compare them.  Wash your breasts and under your arms the day of the test.  Do not wear deodorants, perfumes, lotions, or powders anywhere on your body on the day of the test.  Remove any jewelry from your neck.  Wear clothes that you can change into and out of easily. What happens during the procedure?  You will undress from the waist up and put on a gown.  You will stand in front of the X-ray machine.  Each breast will be placed between two plastic or glass plates. The plates will compress your breast for a few seconds. Try to stay as relaxed as possible during the procedure. This does not cause any harm to your breasts and any discomfort you feel will be very brief.  X-rays will be taken from different angles of each breast. The procedure may  vary among health care providers and hospitals. What happens after the procedure?  The mammogram will be examined by a specialist (radiologist).  You may need to repeat certain parts of the test, depending on the quality of the images. This is commonly done if the radiologist needs a better view of the breast tissue.  Ask when your test results will be ready. Make sure you get your test results.  You may resume your normal activities. This information is not intended to replace advice given to you by your health care provider. Make sure you discuss any questions you have with your health care provider. Document Released: 08/30/2000 Document Revised: 02/05/2016 Document Reviewed: 11/11/2014 Elsevier Interactive Patient Education  2018 [employer] Breast self-awareness means being familiar with how your breasts look and feel. It involves checking your breasts regularly and reporting any  changes to your health care provider. Practicing breast self-awareness is important. A change in your breasts can be a sign of a serious medical problem. Being familiar with how your breasts look and feel allows you to find any problems early, when treatment is more likely to be successful. All women should practice breast self-awareness, including women who have had breast implants. How to do a breast self-exam One way to learn what is normal for your breasts and whether your breasts are changing is to do a breast self-exam. To do a breast self-exam: Look for Changes  1. Remove all the clothing above your waist. 2. Stand in front of a mirror in a room with good lighting. 3. Put your hands on your hips. 4. Push your hands firmly downward. 5. Compare your breasts in the mirror. Look for differences between them (asymmetry), such as: ? Differences in shape. ? Differences in size. ? Puckers, dips, and bumps in one breast and not the other. 6. Look at each breast for changes in your  skin, such as: ? Redness. ? Scaly areas. 7. Look for changes in your nipples, such as: ? Discharge. ? Bleeding. ? Dimpling. ? Redness. ? A change in position. Feel for Changes  Carefully feel your breasts for lumps and changes. It is best to do this while lying on your back on the floor and again while sitting or standing in the shower or tub with soapy water on your skin. Feel each breast in the following way:  Place the arm on the side of the breast you are examining above your head.  Feel your breast with the other hand.  Start in the nipple area and make  inch (2 cm) overlapping circles to feel your breast. Use the pads of your three middle fingers to do this. Apply light pressure, then medium pressure, then firm pressure. The light pressure will allow you to feel the tissue closest to the skin. The medium pressure will allow you to feel the tissue that is a little deeper. The firm pressure will allow you to feel the tissue close to the ribs.  Continue the overlapping circles, moving downward over the breast until you feel your ribs below your breast.  Move one finger-width toward the center of the body. Continue to use the  inch (2 cm) overlapping circles to feel your breast as you move slowly up toward your collarbone.  Continue the up and down exam using all three pressures until you reach your armpit.  Write Down What You Find  Write down what is normal for each breast and any changes that you find. Keep a written record with breast changes or normal findings for each breast. By writing this information down, you do not need to depend only on memory for size, tenderness, or location. Write down where you are in your menstrual cycle, if you are still menstruating. If you are having trouble noticing differences in your breasts, do not get discouraged. With time you will become more familiar with the variations in your breasts and more comfortable with the exam. How often should I  examine my breasts? Examine your breasts every month. If you are breastfeeding, the best time to examine your breasts is after a feeding or after using a breast pump. If you menstruate, the best time to examine your breasts is 5-7 days after your period is over. During your period, your breasts are lumpier, and it may be more difficult to notice changes. When should I  see my health care provider? See your health care provider if you notice:  A change in shape or size of your breasts or nipples.  A change in the skin of your breast or nipples, such as a reddened or scaly area.  Unusual discharge from your nipples.  A lump or thick area that was not there before.  Pain in your breasts.  Anything that concerns you.  This information is not intended to replace advice given to you by your health care provider. Make sure you discuss any questions you have with your health care provider. Document Released: 09/02/2005 Document Revised: 02/08/2016 Document Reviewed: 07/23/2015 Elsevier Interactive Patient Education  [employer].

## 2017-11-26 NOTE — Progress Notes (Signed)
42 y.o. [id number] Married [place] F here for annual exam.  She is a vegetarian. She is c/o fatigue for the last 2 months. No other thyroid c/o. Using condoms for contraception, no dyspareunia.  Period Cycle (Days): 30 Period Duration (Days): 4-7  Period Pattern: Regular Menstrual Flow: Moderate, Heavy Menstrual Control: Maxi pad, Tampon Menstrual Control Change Freq (Hours): 3-4 Dysmenorrhea: None  Patient's last menstrual period was 11/19/2017.          Sexually active: Yes.    The current method of family planning is condoms most of the time.    Exercising: Yes.    Cardio Smoker:  no  Health Maintenance: Pap:  11/20/16 negative, HR HPV negative  History of abnormal Pap:  no MMG:  no Colonoscopy:  no BMD:   no TDaP:  UTD  Gardasil: n/a   reports that  has never smoked. she has never used smokeless tobacco. She reports that she does not drink alcohol or use drugs.  Husband is an Materials engineer with [employer]. She runs a business. They have 2 young sons (34 and 8).   Past Medical History:  Diagnosis Date  . Fibroid     Past Surgical History:  Procedure Laterality Date  . CESAREAN SECTION     2011 and 2013    No current outpatient medications on file.   No current facility-administered medications for this visit.     Family History  Problem Relation Age of Onset  . Hypotension Mother   . Hypertension Father     Review of Systems  Constitutional: Positive for fatigue.  HENT: Negative.   Eyes: Negative.   Respiratory: Negative.   Cardiovascular: Negative.   Gastrointestinal: Negative.   Endocrine: Negative.   Genitourinary: Negative.   Musculoskeletal: Negative.   Skin: Negative.   Allergic/Immunologic: Negative.   Neurological: Negative.   Hematological: Negative.   Psychiatric/Behavioral: Negative.     Exam:   BP (!) 110/54 (BP Location: Right Arm, Patient Position: Sitting, Cuff Size: Normal)   Pulse 72   Resp 14   Ht 5' 1.5" (1.562 m)   Wt 142 lb 8 oz (64.6 kg)    LMP 11/19/2017   BMI 26.49 kg/m    Weight change: @WEIGHTCHANGE @ Height:   Height: 5' 1.5" (156.2 cm)  Ht Readings from Last 3 Encounters:  11/26/17 5' 1.5" (1.562 m)  12/26/16 5' 1.25" (1.556 m)  11/20/16 5' 1.25" (1.556 m)    General appearance: alert, cooperative and appears stated age Head: Normocephalic, without obvious abnormality, atraumatic Neck: no adenopathy, supple, symmetrical, trachea midline and thyroid normal to inspection and palpation Lungs: clear to auscultation bilaterally Cardiovascular: regular rate and rhythm Breasts: normal appearance, no masses or tenderness Abdomen: soft, non-tender; non distended,  no masses,  no organomegaly Extremities: extremities normal, atraumatic, no cyanosis or edema Skin: Skin color, texture, turgor normal. No rashes or lesions Lymph nodes: Cervical, supraclavicular, and axillary nodes normal. No abnormal inguinal nodes palpated Neurologic: Grossly normal   Pelvic: External genitalia:  no lesions              Urethra:  normal appearing urethra with no masses, tenderness or lesions              Bartholins and Skenes: normal                 Vagina: normal appearing vagina with normal color and discharge, no lesions              Cervix: no lesions  Bimanual Exam:  Uterus:  normal size, contour, position, consistency, mobility, non-tender              Adnexa: no mass, fullness, tenderness               Rectovaginal: Confirms               Anus:  normal sphincter tone, no lesions  Chaperone was present for exam.  A:  Well Woman with normal exam  Fatigue  Vit d def  Vegetarian  P:   No pap this year  Discussed breast self exam  Discussed calcium and vit D intake  Discussed mammogram, encouraged her to get   Screening labs, vit D, B12, TSH

## 2017-11-27 LAB — LIPID PANEL
CHOLESTEROL TOTAL: 193 mg/dL (ref 100–199)
Chol/HDL Ratio: 3 ratio (ref 0.0–4.4)
HDL: 64 mg/dL (ref >39)
LDL Calculated: 106 mg/dL — ABNORMAL HIGH (ref 0–99)
TRIGLYCERIDES: 115 mg/dL (ref 0–149)
VLDL CHOLESTEROL CAL: 23 mg/dL (ref 5–40)

## 2017-11-27 LAB — COMPREHENSIVE METABOLIC PANEL
A/G RATIO: 1.9 (ref 1.2–2.2)
ALT: 17 IU/L (ref 0–32)
AST: 18 IU/L (ref 0–40)
Albumin: 4.6 g/dL (ref 3.5–5.5)
Alkaline Phosphatase: 86 IU/L (ref 39–117)
BILIRUBIN TOTAL: 0.2 mg/dL (ref 0.0–1.2)
BUN/Creatinine Ratio: 23 (ref 9–23)
BUN: 15 mg/dL (ref 6–24)
CHLORIDE: 101 mmol/L (ref 96–106)
CO2: 23 mmol/L (ref 20–29)
Calcium: 9.9 mg/dL (ref 8.7–10.2)
Creatinine, Ser: 0.66 mg/dL (ref 0.57–1.00)
GFR calc non Af Amer: 110 mL/min/{1.73_m2} (ref >59)
GFR, EST AFRICAN AMERICAN: 127 mL/min/{1.73_m2} (ref >59)
GLUCOSE: 85 mg/dL (ref 65–99)
Globulin, Total: 2.4 g/dL (ref 1.5–4.5)
POTASSIUM: 4.4 mmol/L (ref 3.5–5.2)
Sodium: 140 mmol/L (ref 134–144)
Total Protein: 7 g/dL (ref 6.0–8.5)

## 2017-11-27 LAB — TSH: TSH: 2.46 u[IU]/mL (ref 0.450–4.500)

## 2017-11-27 LAB — VITAMIN B12: Vitamin B-12: 633 pg/mL (ref 232–1245)

## 2017-11-27 LAB — CBC
Hematocrit: 40.9 % (ref 34.0–46.6)
Hemoglobin: 12.9 g/dL (ref 11.1–15.9)
MCH: 29 pg (ref 26.6–33.0)
MCHC: 31.5 g/dL (ref 31.5–35.7)
MCV: 92 fL (ref 79–97)
PLATELETS: 394 10*3/uL — AB (ref 150–379)
RBC: 4.45 x10E6/uL (ref 3.77–5.28)
RDW: 13 % (ref 12.3–15.4)
WBC: 8.7 10*3/uL (ref 3.4–10.8)

## 2017-11-27 LAB — VITAMIN D 25 HYDROXY (VIT D DEFICIENCY, FRACTURES): Vit D, 25-Hydroxy: 24.9 ng/mL — ABNORMAL LOW (ref 30.0–100.0)

## 2018-04-16 ENCOUNTER — Telehealth: Payer: Self-pay | Admitting: Obstetrics and Gynecology

## 2018-04-16 NOTE — Telephone Encounter (Signed)
Patient is having abnormal bleeding before her period.

## 2018-04-16 NOTE — Telephone Encounter (Signed)
Spoke with patient. Patient reports menses has started early and that this is not normal for her. LMP 7/4, bleeding started again 7/31. Bleeding is light, "feels like start of cycle", denies any other symptoms or pain. SA, no contraceptive.   Recommended UPT, if negative can continue to monitor and calendar menses, return call to office for earlier OV if heavy bleeding or severe pain develops.  OV scheduled with Dr. Talbert Nan  On 05/01/18 at [facility] will review with covering provider and return call with any additional recommendations.   Routing to provider for final review. Patient is agreeable to disposition. Will close encounter.  Cc: Dr. Talbert Nan

## 2018-04-16 NOTE — Telephone Encounter (Signed)
Left message to call Christine Mahoney at [phone].  

## 2018-04-28 NOTE — Progress Notes (Signed)
GYNECOLOGY  VISIT   HPI: 42 y.o.   Married  Asian  female   [phone] with Patient's last menstrual period was 04/14/2018.   here for irregular menses. She had normal cycles every month through May, 2019. In July her cycle came 2 days early, in August cycle was due on the 4th and came 5 days early. The cycle that started on 7/30 didn't feel like a normal cycle, lighter than normal, lasted x 6 days. On 8/3-4 felt more like her normal cycle. Never heavy. No cramps. No mood changes. She had a negative UPT at home a few weeks ago.  Recent normal TSH  GYNECOLOGIC HISTORY: Patient's last menstrual period was 04/14/2018. Contraception:condoms Menopausal hormone therapy: n/a        OB History    Gravida  2   Para  2   Term  2   Preterm      AB      Living  2     SAB      TAB      Ectopic      Multiple      Live Births  2              There are no active problems to display for this patient.   Past Medical History:  Diagnosis Date  . Fibroid     Past Surgical History:  Procedure Laterality Date  . CESAREAN SECTION     2011 and 2013    No current outpatient medications on file.   No current facility-administered medications for this visit.      ALLERGIES: Azithromycin  Family History  Problem Relation Age of Onset  . Hypotension Mother   . Hypertension Father     Social History   Socioeconomic History  . Marital status: Married    Spouse name: Not on file  . Number of children: Not on file  . Years of education: Not on file  . Highest education level: Not on file  Occupational History  . Not on file  Social Needs  . Financial resource strain: Not on file  . Food insecurity:    Worry: Not on file    Inability: Not on file  . Transportation needs:    Medical: Not on file    Non-medical: Not on file  Tobacco Use  . Smoking status: Never Smoker  . Smokeless tobacco: Never Used  Substance and Sexual Activity  . Alcohol use: No  . Drug use:  No  . Sexual activity: Yes    Partners: Male    Birth control/protection: Condom  Lifestyle  . Physical activity:    Days per week: Not on file    Minutes per session: Not on file  . Stress: Not on file  Relationships  . Social connections:    Talks on phone: Not on file    Gets together: Not on file    Attends religious service: Not on file    Active member of club or organization: Not on file    Attends meetings of clubs or organizations: Not on file    Relationship status: Not on file  . Intimate partner violence:    Fear of current or ex partner: Not on file    Emotionally abused: Not on file    Physically abused: Not on file    Forced sexual activity: Not on file  Other Topics Concern  . Not on file  Social History Narrative  . Not on file  Review of Systems  Constitutional: Negative.   HENT: Negative.   Eyes: Negative.   Respiratory: Negative.   Cardiovascular: Negative.   Gastrointestinal: Negative.   Genitourinary: Negative.   Musculoskeletal: Negative.   Skin: Negative.   Neurological: Negative.   Endo/Heme/Allergies: Negative.   Psychiatric/Behavioral: Negative.   All other systems reviewed and are negative.   PHYSICAL EXAMINATION:    BP 102/72   Pulse 67   Ht 5' 1.25" (1.556 m)   Wt 137 lb (62.1 kg)   LMP 04/14/2018   BMI 25.68 kg/m     General appearance: alert, cooperative and appears stated age  Pelvic: External genitalia:  no lesions              Urethra:  normal appearing urethra with no masses, tenderness or lesions              Bartholins and Skenes: normal                 Vagina: normal appearing vagina with normal color and discharge, no lesions              Cervix: no cervical motion tenderness and no lesions              Bimanual Exam:  Uterus:  uterus feels slightly enlarged, irregular, suspect fibroid off of right fundus (4-5 cm). Not tender              Adnexa: no mass, fullness, tenderness               Chaperone was present  for exam.  Reviewed ultrasound images from 12/16, small myomas noted at that time  ASSESSMENT Slightly irregular cycles for the last few months. Recent normal TSH Suspect fibroid on exam (h/o small myoma on prior u/s)    PLAN Calendar cycles and f/u in 3 months Repeat exam in 3 months, if changing will set up an ultrasound   An After Visit Summary was printed and given to the patient.  Over 15 minutes face to face time of which over 50% was spent in counseling.

## 2018-05-01 ENCOUNTER — Other Ambulatory Visit: Payer: Self-pay

## 2018-05-01 ENCOUNTER — Ambulatory Visit: Payer: 59 | Admitting: Obstetrics and Gynecology

## 2018-05-01 ENCOUNTER — Encounter: Payer: Self-pay | Admitting: Obstetrics and Gynecology

## 2018-05-01 VITALS — BP 102/72 | HR 67 | Ht 61.25 in | Wt 137.0 lb

## 2018-05-01 DIAGNOSIS — N926 Irregular menstruation, unspecified: Secondary | ICD-10-CM

## 2018-05-01 DIAGNOSIS — D259 Leiomyoma of uterus, unspecified: Secondary | ICD-10-CM | POA: Diagnosis not present

## 2018-05-11 ENCOUNTER — Telehealth: Payer: Self-pay | Admitting: Obstetrics and Gynecology

## 2018-05-11 DIAGNOSIS — N926 Irregular menstruation, unspecified: Secondary | ICD-10-CM

## 2018-05-11 DIAGNOSIS — D259 Leiomyoma of uterus, unspecified: Secondary | ICD-10-CM

## 2018-05-11 NOTE — Telephone Encounter (Signed)
Spoke with patient. PUS scheduled for 05/19/18 at 1:00 pm, consult to follow at 1:45pm with Dr. Talbert Nan.   Order placed for PUS for precert.   Patient verbalizes understanding and is agreeable.   Routing to [employer] for [employer].   Encounter closed.

## 2018-05-11 NOTE — Telephone Encounter (Signed)
Spoke with patient. Patient seen in office on 8/16 for irregular cycles. Was advised to calendar cycles and f/u 3 months. Patient states menses has started 5 days early, asking if PUS and "hormone levels" can be done now and not wait 3 months?   Last PUS 08/2015 -small myoma  Advised will review with Dr. Talbert Nan and return call.    Dr. Talbert Nan -ok to proceed with PUS scheduling?

## 2018-05-11 NOTE — Telephone Encounter (Signed)
Patient called and states she would like to have ultrasound and blood work sooner than the 3 months that Dr Talbert Nan recommended on 05/01/18. Patient states she is still having the same symptoms. Please advise.

## 2018-05-11 NOTE — Telephone Encounter (Signed)
Please go ahead and set up her ultrasound, diagnosis fibroid uterus and irregular cycle. She had a recent normal TSH. I don't think any other hormonal testing will be helpful, there is a wide range of normal and given that she is still having cycles I'm sure she will fall in the normal range.

## 2018-05-19 ENCOUNTER — Ambulatory Visit: Payer: 59 | Admitting: Obstetrics and Gynecology

## 2018-05-19 ENCOUNTER — Other Ambulatory Visit: Payer: Self-pay

## 2018-05-19 ENCOUNTER — Encounter: Payer: Self-pay | Admitting: Obstetrics and Gynecology

## 2018-05-19 ENCOUNTER — Ambulatory Visit (INDEPENDENT_AMBULATORY_CARE_PROVIDER_SITE_OTHER): Payer: 59

## 2018-05-19 VITALS — BP 90/56 | HR 70 | Resp 12 | Ht 62.75 in | Wt 140.2 lb

## 2018-05-19 DIAGNOSIS — D259 Leiomyoma of uterus, unspecified: Secondary | ICD-10-CM

## 2018-05-19 DIAGNOSIS — N926 Irregular menstruation, unspecified: Secondary | ICD-10-CM

## 2018-05-19 NOTE — Progress Notes (Signed)
GYNECOLOGY  VISIT   HPI: 42 y.o.   Married  [place]  female   [phone] with Patient's last menstrual period was 05/11/2018.   here for   Pelvic ultrasound  Her cycles have been irregular and at her last visit her uterus felt slightly larger (h/o small myomas). LMP was 8/26, PMP was 7/30. This cycle was 4 days early.   GYNECOLOGIC HISTORY: Patient's last menstrual period was 05/11/2018. Contraception: condoms  Menopausal hormone therapy: none         OB History    Gravida  2   Para  2   Term  2   Preterm      AB      Living  2     SAB      TAB      Ectopic      Multiple      Live Births  2              There are no active problems to display for this patient.   Past Medical History:  Diagnosis Date  . Fibroid     Past Surgical History:  Procedure Laterality Date  . CESAREAN SECTION     2011 and 2013    No current outpatient medications on file.   No current facility-administered medications for this visit.      ALLERGIES: Azithromycin  Family History  Problem Relation Age of Onset  . Hypotension Mother   . Hypertension Father     Social History   Socioeconomic History  . Marital status: Married    Spouse name: Not on file  . Number of children: Not on file  . Years of education: Not on file  . Highest education level: Not on file  Occupational History  . Not on file  Social Needs  . Financial resource strain: Not on file  . Food insecurity:    Worry: Not on file    Inability: Not on file  . Transportation needs:    Medical: Not on file    Non-medical: Not on file  Tobacco Use  . Smoking status: Never Smoker  . Smokeless tobacco: Never Used  Substance and Sexual Activity  . Alcohol use: No  . Drug use: No  . Sexual activity: Yes    Partners: Male    Birth control/protection: Condom  Lifestyle  . Physical activity:    Days per week: Not on file    Minutes per session: Not on file  . Stress: Not on file    Relationships  . Social connections:    Talks on phone: Not on file    Gets together: Not on file    Attends religious service: Not on file    Active member of club or organization: Not on file    Attends meetings of clubs or organizations: Not on file    Relationship status: Not on file  . Intimate partner violence:    Fear of current or ex partner: Not on file    Emotionally abused: Not on file    Physically abused: Not on file    Forced sexual activity: Not on file  Other Topics Concern  . Not on file  Social History Narrative  . Not on file    Review of Systems  Constitutional: Negative.   HENT: Negative.   Eyes: Negative.   Respiratory: Negative.   Cardiovascular: Negative.   Gastrointestinal: Negative.   Genitourinary:       Menstrual  cycle changes   Musculoskeletal: Negative.   Skin: Negative.   Neurological: Negative.   Endo/Heme/Allergies: Negative.   Psychiatric/Behavioral: Negative.     PHYSICAL EXAMINATION:    BP (!) 90/56 (BP Location: Right Arm, Patient Position: Sitting, Cuff Size: Normal)   Pulse 70   Resp 12   Ht 5' 2.75" (1.594 m)   Wt 140 lb 4 oz (63.6 kg)   LMP 05/11/2018   BMI 25.04 kg/m     General appearance: alert, cooperative and appears stated age  Ultrasound images reviewed with the patient  ASSESSMENT Slight irregularity in her cycle, normal TSH in 3/19 Fibroid uterus, slightly larger in the last few years    PLAN Calendar cycles Reassured that her fibroids are still small, don't anticipate they will be problematic for her.    An After Visit Summary was printed and given to the patient.

## 2018-07-24 ENCOUNTER — Ambulatory Visit (INDEPENDENT_AMBULATORY_CARE_PROVIDER_SITE_OTHER): Payer: 59

## 2018-07-24 ENCOUNTER — Encounter: Payer: Self-pay | Admitting: Physician Assistant

## 2018-07-24 ENCOUNTER — Ambulatory Visit: Payer: 59 | Admitting: Physician Assistant

## 2018-07-24 VITALS — BP 104/60 | HR 76 | Temp 97.9°F | Ht 62.75 in | Wt 144.4 lb

## 2018-07-24 DIAGNOSIS — S6991XA Unspecified injury of right wrist, hand and finger(s), initial encounter: Secondary | ICD-10-CM | POA: Diagnosis not present

## 2018-07-24 DIAGNOSIS — M79641 Pain in right hand: Secondary | ICD-10-CM

## 2018-07-24 NOTE — Patient Instructions (Addendum)
It was great to see you!   We will be in touch with your official xray results.   Consider using the topical pennsaid for your pain. If symptoms persist, please follow-up with Dr. Teresa Coombs here in our office for further evaluation. You can make this appointment by calling our office and asking to see Dr. Paulla Fore.    Take care,  Inda Coke PA-C   Pennsaid instructions: You have been given a sample/prescription for Pennsaid, a topical medication.     You are to apply this gel to your injured body part twice daily (morning and evening).   A little goes a long way so you can use about a pea-sized amount for each area.   Spread this small amount over the area into a thin film and let it dry.   Be sure that you do not rub the gel into your skin for more than 10 or 15 seconds otherwise it can irritate you skin.    Once you apply the gel, please do not put any other lotion or clothing in contact with that area for 30 minutes to allow the gel to absorb into your skin.   Some people are sensitive to the medication and can develop a sunburn-like rash.  If you have only mild symptoms it is okay to continue to use the medication but if you have any breakdown of your skin you should discontinue its use and please let us know.   If you have been written a prescription for Pennsaid, you will receive a pump bottle of this topical gel through a mail order pharmacy.  The instructions on the bottle will say to apply two pumps twice a day which may be too much gel for your particular area so use the pea-sized amount as your guide.

## 2018-07-24 NOTE — Progress Notes (Signed)
Christine Mahoney is a 42 y.o. female here for a new problem.  I acted as a Education administrator for [employer], PA-C Anselmo Pickler, LPN  History of Present Illness:   Chief Complaint  Patient presents with  . Pain in Rt hand    HPI  Right hand pain Pt c/o right hand pain and swelling x 6 weeks, between 2nd and 3rd finger. Difficulty using fingers when cutting vegetables, lifting things. She is R handed. Inciting event -- she fell on her hand while her fist was in a ball and she fell on her knuckles.  Denies numbness, tingling. She trialed ibuprofen a few times -- unable to say if there was any improvement     Past Medical History:  Diagnosis Date  . Fibroid      Social History   Socioeconomic History  . Marital status: Married    Spouse name: Not on file  . Number of children: Not on file  . Years of education: Not on file  . Highest education level: Not on file  Occupational History  . Not on file  Social Needs  . Financial resource strain: Not on file  . Food insecurity:    Worry: Not on file    Inability: Not on file  . Transportation needs:    Medical: Not on file    Non-medical: Not on file  Tobacco Use  . Smoking status: Never Smoker  . Smokeless tobacco: Never Used  Substance and Sexual Activity  . Alcohol use: No  . Drug use: No  . Sexual activity: Yes    Partners: Male    Birth control/protection: Condom  Lifestyle  . Physical activity:    Days per week: Not on file    Minutes per session: Not on file  . Stress: Not on file  Relationships  . Social connections:    Talks on phone: Not on file    Gets together: Not on file    Attends religious service: Not on file    Active member of club or organization: Not on file    Attends meetings of clubs or organizations: Not on file    Relationship status: Not on file  . Intimate partner violence:    Fear of current or ex partner: Not on file    Emotionally abused: Not on file    Physically abused: Not on file   Forced sexual activity: Not on file  Other Topics Concern  . Not on file  Social History Narrative  . Not on file    Past Surgical History:  Procedure Laterality Date  . CESAREAN SECTION     2011 and 2013    Family History  Problem Relation Age of Onset  . Hypotension Mother   . Hypertension Father     Allergies  Allergen Reactions  . Azithromycin Hives    Current Medications:   Current Outpatient Medications:  Marland Kitchen  Multiple Vitamin (MULTIVITAMIN) tablet, Take 1 tablet by mouth daily., Disp: , Rfl:    Review of Systems:   ROS  Negative unless otherwise specified per HPI.  Vitals:   Vitals:   07/24/18 0906  BP: 104/60  Pulse: 76  Temp: 97.9 F (36.6 C)  TempSrc: Oral  SpO2: 97%  Weight: 144 lb 6.1 oz (65.5 kg)  Height: 5' 2.75" (1.594 m)     Body mass index is 25.78 kg/m.  Physical Exam:   Physical Exam  Constitutional: Vital signs are normal.  Cardiovascular:  Pulses:  Radial pulses are 2+ on the right side, and 2+ on the left side.  Musculoskeletal:  Point tenderness and mild swelling between 2nd and 3rd interdigital webspace on R hand. No erythema or ecchymosis.  Neurological:  Normal sensation to upper extremities  Nursing note and vitals reviewed.  R hand xray: awaiting read  Assessment and Plan:    Jalani was seen today for pain in rt hand.  Diagnoses and all orders for this visit:  Hand pain, right -     DG Hand Complete Right; Future   No red flags on exam. Briefly reviewed films with Dr. Paulla Fore -- no evidence of acute fracture, however awaiting radiology read. Will trial pennsaid. Follow-up with Dr. Paulla Fore if no improvement within 2 weeks.  . Reviewed expectations re: course of current medical issues. . Discussed self-management of symptoms. . Outlined signs and symptoms indicating need for more acute intervention. . Patient verbalized understanding and all questions were answered. . See orders for this visit as documented in  the electronic medical record. . Patient received an After-Visit Summary.  CMA or LPN served as scribe during this visit. History, Physical, and Plan performed by medical provider. The above documentation has been reviewed and is accurate and complete.  Inda Coke, PA-C

## 2018-07-30 DIAGNOSIS — S63650A Sprain of metacarpophalangeal joint of right index finger, initial encounter: Secondary | ICD-10-CM | POA: Diagnosis not present

## 2018-08-03 ENCOUNTER — Ambulatory Visit (INDEPENDENT_AMBULATORY_CARE_PROVIDER_SITE_OTHER): Payer: 59 | Admitting: Family Medicine

## 2018-08-04 ENCOUNTER — Telehealth: Payer: Self-pay | Admitting: Obstetrics and Gynecology

## 2018-08-04 NOTE — Telephone Encounter (Addendum)
Patient canceled upcoming 3 month appointment 08/06/18. Patient did not wish to reschedule, stating she no longer needs this appointment.

## 2018-08-06 ENCOUNTER — Ambulatory Visit: Payer: 59 | Admitting: Obstetrics and Gynecology

## 2018-10-27 ENCOUNTER — Ambulatory Visit: Payer: 59 | Admitting: Sports Medicine

## 2018-10-27 ENCOUNTER — Encounter: Payer: Self-pay | Admitting: Sports Medicine

## 2018-10-27 VITALS — BP 100/62 | HR 80 | Ht 62.75 in | Wt 144.2 lb

## 2018-10-27 DIAGNOSIS — M545 Low back pain, unspecified: Secondary | ICD-10-CM

## 2018-10-27 DIAGNOSIS — M9908 Segmental and somatic dysfunction of rib cage: Secondary | ICD-10-CM

## 2018-10-27 DIAGNOSIS — M9903 Segmental and somatic dysfunction of lumbar region: Secondary | ICD-10-CM | POA: Diagnosis not present

## 2018-10-27 DIAGNOSIS — M9905 Segmental and somatic dysfunction of pelvic region: Secondary | ICD-10-CM

## 2018-10-27 DIAGNOSIS — M9904 Segmental and somatic dysfunction of sacral region: Secondary | ICD-10-CM | POA: Diagnosis not present

## 2018-10-27 DIAGNOSIS — M9902 Segmental and somatic dysfunction of thoracic region: Secondary | ICD-10-CM | POA: Diagnosis not present

## 2018-10-27 MED ORDER — CYCLOBENZAPRINE HCL 10 MG PO TABS
10.0000 mg | ORAL_TABLET | Freq: Every day | ORAL | 1 refills | Status: DC
Start: 2018-10-27 — End: 2020-06-12

## 2018-10-27 NOTE — Patient Instructions (Addendum)
Also check out [employer]" which is a program developed by Dr. Minerva Ends.   There are links to a couple of his YouTube Videos below and I would like to see you performing one of his videos 5-6 days per week.  It is best to do these exercises first thing in the morning.  They will give you a good jumpstart here today and start normalizing the way you move.  A good intro video is: "Independence from Pain 7-minute Video" - [email or web address]   A more advanced video is: Interior and spatial designer original 12 minutes" - [email or web address].king-[email or web address]/  Exercises that focus more on the neck are as below: Dr. Archie Balboa with [place] teaching neck and shoulder details Part 1 - https://youtu.be/[email or web address] Part 2 Dr. Archie Balboa with [facility] quick routine to practice daily - https://youtu.be/[email or web address]  Do not try to attempt the entire video when first beginning.  Try breaking of each exercise that he goes into shorter segments.  In other words, if they perform an exercise for 45 seconds, start with 15 seconds and rest and then resume when they begin the new activity.  If you work your way up to being able to do these videos without having to stop, I expect you will see significant improvements in your pain.  If you enjoy his videos and would like to find out more you can look on his website: [email or web address]  He has a workout streaming option as well as a DVD set available for purchase.  Amazon has the best price for his DVDs.     Please perform the exercise program that we have prepared for you and gone over in detail on a daily basis.  In addition to the handout you were provided you can access your program through: [email or web address]-[email or web address]   Your unique program code is: [email or web address]

## 2018-10-27 NOTE — Progress Notes (Signed)
Christine Mahoney. Christine Mahoney, [place] [facility] - 43 y.o. female MRN [id number]  Date of birth: 18-Jun-1976  Visit Date: October 27, 2018  PCP: Inda Coke, [place]   Referred by: Inda Coke, [place]  SUBJECTIVE:  Chief Complaint  Patient presents with  . New Patient (Initial Visit)    Low back pain    HPI: Patient presents with worsening back pain over the last 3 days.  She has had nighttime disturbances with this.  She is significantly increased her physical activity level including playing tennis and lifting weights at the gym after being fairly inactive for 2 years after an acute back flareup similar to this in the past.  Symptoms are worse with sitting and bending forward.  She is been using topical magnesium salve and meloxicam.  Prior chiropractic treatment was beneficial  REVIEW OF SYSTEMS: Denies fevers, chills, recent weight gain or weight loss.  No night sweats.  Pt denies any change in bowel or bladder habits, muscle weakness, numbness or falls associated with this pain. Otherwise 12 point review of systems performed and is negative   HISTORY:  Prior history reviewed and updated per electronic medical record.  There are no active problems to display for this patient.  Social History   Occupational History  . Not on file  Tobacco Use  . Smoking status: Never Smoker  . Smokeless tobacco: Never Used  Substance and Sexual Activity  . Alcohol use: No  . Drug use: No  . Sexual activity: Yes    Partners: Male    Birth control/protection: Condom   Social History   Social History Narrative  . Not on file   Past Medical History:  Diagnosis Date  . Fibroid    Past Surgical History:  Procedure Laterality Date  . CESAREAN SECTION     2011 and 2013   family history includes Hypertension in her father; Hypotension in her mother.  OBJECTIVE:  VS:  HT:5' 2.75" (159.4 cm)   WT:144 lb 3.2 oz (65.4  kg)  BMI:25.74    BP:100/62  HR:80bpm  TEMP: ( )  RESP:96 %   PHYSICAL EXAM: CONSTITUTIONAL: Well-developed, Well-nourished and In no acute distress EYES: Pupils are equal., EOM intact without nystagmus. and No scleral icterus. Psychiatric: Alert & appropriately interactive. and Not depressed or anxious appearing. EXTREMITY EXAM: Warm and well perfused  Back is overall well aligned without significant deformity.  She has bilateral paraspinal muscle spasms.  She has a generalized hip flexed position that responds favorably to osteopathic manipulation.  She has most focal tenderness over the left lateral hip joint.  She is able heel and toe walk without difficulty.  Negative Corky Sox and Fader testing.  Negative Stinchfield test   ASSESSMENT:   1. Somatic dysfunction of thoracic region   2. Somatic dysfunction of lumbar region   3. Somatic dysfunction of rib cage region   4. Somatic dysfunction of pelvis region   5. Somatic dysfunction of sacral region   6. Acute bilateral low back pain without sciatica     PROCEDURES:  PROCEDURE NOTE: OSTEOPATHIC MANIPULATION   The decision today to treat with Osteopathic Manipulative Therapy (OMT) was based on physical exam findings. Verbal consent was obtained following a discussion with the patient regarding the of risks, benefits and potential side effects, including an acute pain flare,post manipulation soreness and need for repeat treatments.  NONE  Manipulation was performed as below:  [facility]  Treated & Osteopathic Exam Findings  THORACIC SPINE:  T6 - 10 Neutral, rotated LEFT, sidebent RIGHT RIBS:  Rib 7 Right  Posterior LUMBAR SPINE:  L4 FRS LEFT (Flexed, Rotated & Sidebent) PELVIS:  Left psoas spasm Left anterior innonimate SACRUM:  R on R sacral torsion    OMT Techniques Used:  HVLA muscle energy myofascial release HVLA - Long Lever    The patient tolerated the treatment well and reported Improved symptoms following treatment  today. Patient was given medications, exercises, stretches and lifestyle modifications per AVS and verbally.    PROCEDURE NOTE: THERAPEUTIC EXERCISES [phone])   Discussed the foundation of treatment for this condition is physical therapy and/or daily (5-6 days/week) therapeutic exercises, focusing on core strengthening, coordination, neuromuscular control/reeducation. 15 minutes spent for Therapeutic exercises as below and as referenced in the AVS. This included exercises focusing on stretching, strengthening, with significant focus on eccentric aspects.  Proper technique shown and discussed handout in great detail with ATC. All questions were discussed and answered.   Long term goals include an improvement in range of motion, strength, endurance as well as avoiding reinjury. Frequency of visits is one time as determined during today's office visit. Frequency of exercises to be performed is as per handout.  EXERCISES REVIEWED:   Archie Balboa Exercises Hip ABduction strengthening with focus on Glute Medius Recruitment Hip flexor stretching      PLAN:  Pertinent additional documentation may be included in corresponding procedure notes, imaging studies, problem based documentation and patient instructions.  Symptoms do seem to be more consistent with hip flexor contractures exacerbated by prolonged sitting.  Links to [employer] provided today per Patient Instructions.  These exercises were developed by Minerva Ends, DC with a strong emphasis on core neuromuscular reducation and postural realignment through body-weight exercises.  Home Therapeutic exercises prescribed today per procedure note.  Discussed the underlying features of tight hip flexors leading to crouched, fetal like position that results in spinal column compression.  Including lumbar hyperflexion with hypermobility, thoracic flexion with restrictive rotation and cervical lordosis reversal.   Osteopathic manipulation  was performed today based on physical exam findings.  Patient was counseled on the purpose and expected outcome of osteopathic manipulation and understands that a single treatment may not provide permanent long lasting relief.  They understand that home therapeutic exercises are critical part of the healing/treatment process and will continue with self treatment between now and their next visit as outlined.  The patient understands that the frequency of visits is meant to provide a stimulus to promote the body's own ability to heal and is not meant to be the sole means for improvement in their symptoms.  Activity modifications and the importance of avoiding exacerbating activities (limiting pain to no more than a 4 / 10 during or following activity) recommended and discussed.  Discussed red flag symptoms that warrant earlier emergent evaluation and patient voices understanding.   Meds ordered this encounter  Medications  . cyclobenzaprine (FLEXERIL) 10 MG tablet    Sig: Take 1 tablet (10 mg total) by mouth at bedtime.    Dispense:  30 tablet    Refill:  1   Lab Orders  No laboratory test(s) ordered today   Imaging Orders  No imaging studies ordered today   Referral Orders  No referral(s) requested today    At follow up will plan to consider: repeat osteopathic manipulation  Return in about 2 weeks (around 11/10/2018) for consideration of repeat Osteopathic Manipulation.  Gerda Diss, [facility] Physician

## 2018-10-29 DIAGNOSIS — M9905 Segmental and somatic dysfunction of pelvic region: Secondary | ICD-10-CM | POA: Diagnosis not present

## 2018-10-29 DIAGNOSIS — M9902 Segmental and somatic dysfunction of thoracic region: Secondary | ICD-10-CM | POA: Diagnosis not present

## 2018-10-29 DIAGNOSIS — M9903 Segmental and somatic dysfunction of lumbar region: Secondary | ICD-10-CM | POA: Diagnosis not present

## 2018-10-29 DIAGNOSIS — M5431 Sciatica, right side: Secondary | ICD-10-CM | POA: Diagnosis not present

## 2018-11-10 ENCOUNTER — Ambulatory Visit: Payer: 59 | Admitting: Sports Medicine

## 2018-12-30 ENCOUNTER — Ambulatory Visit: Payer: 59 | Admitting: Obstetrics and Gynecology

## 2018-12-30 ENCOUNTER — Encounter

## 2019-08-17 ENCOUNTER — Ambulatory Visit (INDEPENDENT_AMBULATORY_CARE_PROVIDER_SITE_OTHER): Payer: 59

## 2019-08-17 ENCOUNTER — Ambulatory Visit: Payer: 59

## 2019-08-17 ENCOUNTER — Other Ambulatory Visit: Payer: Self-pay

## 2019-08-17 DIAGNOSIS — Z23 Encounter for immunization: Secondary | ICD-10-CM

## 2019-08-17 NOTE — Progress Notes (Signed)
Per orders of Inda Coke, [place], injection of influenza (regular) given by Serita Sheller in left deltoid. Patient tolerated injection well.

## 2020-02-23 DIAGNOSIS — M722 Plantar fascial fibromatosis: Secondary | ICD-10-CM | POA: Diagnosis not present

## 2020-02-23 DIAGNOSIS — M79672 Pain in left foot: Secondary | ICD-10-CM | POA: Diagnosis not present

## 2020-02-23 DIAGNOSIS — M79671 Pain in right foot: Secondary | ICD-10-CM | POA: Diagnosis not present

## 2020-02-24 ENCOUNTER — Ambulatory Visit: Payer: 59 | Admitting: Family Medicine

## 2020-03-07 DIAGNOSIS — M9905 Segmental and somatic dysfunction of pelvic region: Secondary | ICD-10-CM | POA: Diagnosis not present

## 2020-03-07 DIAGNOSIS — M9903 Segmental and somatic dysfunction of lumbar region: Secondary | ICD-10-CM | POA: Diagnosis not present

## 2020-03-07 DIAGNOSIS — M9902 Segmental and somatic dysfunction of thoracic region: Secondary | ICD-10-CM | POA: Diagnosis not present

## 2020-03-07 DIAGNOSIS — M5431 Sciatica, right side: Secondary | ICD-10-CM | POA: Diagnosis not present

## 2020-03-24 DIAGNOSIS — M722 Plantar fascial fibromatosis: Secondary | ICD-10-CM | POA: Diagnosis not present

## 2020-03-31 DIAGNOSIS — M722 Plantar fascial fibromatosis: Secondary | ICD-10-CM | POA: Diagnosis not present

## 2020-04-10 DIAGNOSIS — M722 Plantar fascial fibromatosis: Secondary | ICD-10-CM | POA: Diagnosis not present

## 2020-04-22 DIAGNOSIS — M25572 Pain in left ankle and joints of left foot: Secondary | ICD-10-CM | POA: Diagnosis not present

## 2020-04-24 DIAGNOSIS — M722 Plantar fascial fibromatosis: Secondary | ICD-10-CM | POA: Diagnosis not present

## 2020-06-12 ENCOUNTER — Other Ambulatory Visit: Payer: Self-pay

## 2020-06-12 ENCOUNTER — Encounter: Payer: Self-pay | Admitting: Obstetrics and Gynecology

## 2020-06-12 ENCOUNTER — Ambulatory Visit: Payer: 59 | Admitting: Obstetrics and Gynecology

## 2020-06-12 ENCOUNTER — Telehealth: Payer: Self-pay

## 2020-06-12 VITALS — BP 104/60 | HR 88 | Temp 98.3°F | Resp 14 | Ht 63.0 in | Wt 147.0 lb

## 2020-06-12 DIAGNOSIS — N766 Ulceration of vulva: Secondary | ICD-10-CM | POA: Diagnosis not present

## 2020-06-12 DIAGNOSIS — L03317 Cellulitis of buttock: Secondary | ICD-10-CM

## 2020-06-12 MED ORDER — VALACYCLOVIR HCL 1 G PO TABS: 1000.0000 mg | ORAL_TABLET | Freq: Two times a day (BID) | ORAL | 0 refills | Status: DC

## 2020-06-12 MED ORDER — LIDOCAINE 5 % EX OINT: 1.0000 "application " | TOPICAL_OINTMENT | Freq: Four times a day (QID) | CUTANEOUS | 0 refills | Status: DC | PRN

## 2020-06-12 MED ORDER — SULFAMETHOXAZOLE-TRIMETHOPRIM 800-160 MG PO TABS: 1.0000 | ORAL_TABLET | Freq: Two times a day (BID) | ORAL | 0 refills | Status: DC

## 2020-06-12 NOTE — Progress Notes (Signed)
GYNECOLOGY  VISIT   HPI: 44 y.o.   Married Asian Not Hispanic or Latino  female   [phone] with Patient's last menstrual period was 05/15/2020.   here for swollen labia and blisters. Per patient, has had blisters with swollen right labia for about 4-5 days. Patient states that this has never happened in the past and blisters are painful. Patient also states that she has been fatigued and had a "burning sensation" at the top of the right foot and right lower leg.   Very fatigued for 10 days. No fevers.  Her right lower back has been hurting from a move. She put some vicks vapor rub on it and it got itchy.   GYNECOLOGIC HISTORY: Patient's last menstrual period was 05/15/2020. Contraception: none Menopausal hormone therapy: none        OB History    Gravida  2   Para  2   Term  2   Preterm      AB      Living  2     SAB      TAB      Ectopic      Multiple      Live Births  2              There are no problems to display for this patient.   Past Medical History:  Diagnosis Date  . Fibroid     Past Surgical History:  Procedure Laterality Date  . CESAREAN SECTION     2011 and 2013    Current Outpatient Medications  Medication Sig Dispense Refill  . Multiple Vitamin (MULTIVITAMIN) tablet Take 1 tablet by mouth daily.     No current facility-administered medications for this visit.     ALLERGIES: Azithromycin  Family History  Problem Relation Age of Onset  . Hypotension Mother   . Hypertension Father     Social History   Socioeconomic History  . Marital status: Married    Spouse name: Not on file  . Number of children: Not on file  . Years of education: Not on file  . Highest education level: Not on file  Occupational History  . Not on file  Tobacco Use  . Smoking status: Never Smoker  . Smokeless tobacco: Never Used  Substance and Sexual Activity  . Alcohol use: No  . Drug use: No  . Sexual activity: Yes    Partners: Male    Birth  control/protection: Condom  Other Topics Concern  . Not on file  Social History Narrative  . Not on file   Social Determinants of Health   Financial Resource Strain:   . Difficulty of Paying Living Expenses: Not on file  Food Insecurity:   . Worried About Charity fundraiser in the Last Year: Not on file  . Ran Out of Food in the Last Year: Not on file  Transportation Needs:   . Lack of Transportation (Medical): Not on file  . Lack of Transportation (Non-Medical): Not on file  Physical Activity:   . Days of Exercise per Week: Not on file  . Minutes of Exercise per Session: Not on file  Stress:   . Feeling of Stress : Not on file  Social Connections:   . Frequency of Communication with Friends and Family: Not on file  . Frequency of Social Gatherings with Friends and Family: Not on file  . Attends Religious Services: Not on file  . Active Member of Clubs or Organizations: Not  on file  . Attends Archivist Meetings: Not on file  . Marital Status: Not on file  Intimate Partner Violence:   . Fear of Current or Ex-Partner: Not on file  . Emotionally Abused: Not on file  . Physically Abused: Not on file  . Sexually Abused: Not on file    Review of Systems  Constitutional: Positive for malaise/fatigue.  HENT: Negative.   Eyes: Negative.   Respiratory: Negative.   Cardiovascular: Negative.   Gastrointestinal: Negative.   Genitourinary:       Swollen labia  Blisters on labia  Musculoskeletal: Negative.   Skin: Negative.   Neurological: Negative.   Endo/Heme/Allergies: Negative.   Psychiatric/Behavioral: Negative.     PHYSICAL EXAMINATION:    BP 104/60 (BP Location: Right Arm, Patient Position: Sitting, Cuff Size: Normal)   Pulse 88   Resp 14   Ht 5\' 3"  (1.6 m)   Wt 147 lb (66.7 kg)   LMP 05/15/2020   BMI 26.04 kg/m     General appearance: alert, cooperative and appears stated age Right lower back/upper buttock: large well demarcated area with marked  erythema, smooth edges, hot to touch. Edge of lesion marked with a pen. There does appear to be an area of blisters in the lower edge of the erythematous area. This is separate from the extensive vulvar ulcerations  Pelvic: External genitalia:  Severe vulvar ulcers covering the right labia majora, going to around the introitus.               Chaperone was present for exam.  ASSESSMENT Severe vulvar ulcerations, most c/w primary herpes outbreak She appears to have cellulitis on her right lower back/upper buttock. She thought she might be having an allergic response to Vicks Vapor Rub, but the area of erythema is smooth, hot and well demarcated.    PLAN Treat with Valtrex Lidocaine ointment for pain Void in sitz bath Herpes PCR sent off Bactrim DS for cellulitis F/U tomorrow Call with fevers or worsening symptoms  CC: Inda Coke, PA

## 2020-06-12 NOTE — Telephone Encounter (Signed)
Patient is calling in regards to swollen labia area. Patient states it is "very painful and spreading".

## 2020-06-12 NOTE — Telephone Encounter (Signed)
Spoke with patient. Reports painful, swollen labia and "blisters and boils" spreading on the perirectal area. Symptoms started 2 days ago. Patient denies any other symptoms. No partner changes. Reports no Hx of HSV.   OV recommended, patient agreeable. OV scheduled for today at 10:30am w/ Dr. Talbert Nan.  Last AEX 11/26/17. Recommended updating AEX, patient will schedule while in office.  Patient verbalizes understanding and is agreeable.   Encounter closed.

## 2020-06-13 ENCOUNTER — Ambulatory Visit: Payer: 59 | Admitting: Physician Assistant

## 2020-06-13 ENCOUNTER — Telehealth: Payer: Self-pay

## 2020-06-13 ENCOUNTER — Encounter: Payer: Self-pay | Admitting: Physician Assistant

## 2020-06-13 ENCOUNTER — Ambulatory Visit: Payer: 59 | Admitting: Obstetrics and Gynecology

## 2020-06-13 VITALS — BP 118/70 | HR 103 | Temp 98.4°F | Ht 63.0 in | Wt 149.5 lb

## 2020-06-13 DIAGNOSIS — R5383 Other fatigue: Secondary | ICD-10-CM | POA: Diagnosis not present

## 2020-06-13 DIAGNOSIS — R21 Rash and other nonspecific skin eruption: Secondary | ICD-10-CM

## 2020-06-13 LAB — POCT URINE PREGNANCY: Preg Test, Ur: NEGATIVE

## 2020-06-13 MED ORDER — LIDOCAINE 5 % EX OINT
1.0000 | TOPICAL_OINTMENT | Freq: Four times a day (QID) | CUTANEOUS | 0 refills | Status: DC | PRN
Start: 2020-06-13 — End: 2020-08-03

## 2020-06-13 NOTE — Progress Notes (Signed)
Christine Mahoney is a 44 y.o. female here for a new problem.  I acted as a Education administrator for [employer], PA-C Anselmo Pickler, LPN   History of Present Illness:   Chief Complaint  Patient presents with  . Fatigue  . Rash    HPI   Patient was seen yesterday by her ob-gyn for cellulitis of her buttocks and possible herpes. She was prescribed valtrex, bactrim and topical lidocaine ointment for her symptoms.  Fatigue Pt c/o feeling very tired for the past 10-12 days. She states that feels like she cannot take care of her household chores do to her fatigue. She is getting 7-8 hours of sleep a night. Denies changes in appetite, SOB, chest pain, LE swelling. She is overdue for labwork and would like to update labs today. She is vegetarian, does not take supplement regularly.  Rash "Sunday patient c/o red area on her lower back. Pt applied Move cream to her lower back for back pain and had a reaction to the cream. She was given oral bactrim from Dr. Jertson yesterday who thought she may have cellulitis. Patient did not start this. Area has dramatically improved since yesterday. Denies: malaise, fever, chills, drainage from area.      Patient's last menstrual period was 05/15/2020.    Past Medical History:  Diagnosis Date  . Fibroid      Social History   Tobacco Use  . Smoking status: Never Smoker  . Smokeless tobacco: Never Used  Substance Use Topics  . Alcohol use: No  . Drug use: No    Past Surgical History:  Procedure Laterality Date  . CESAREAN SECTION     20" 11 and 2013    Family History  Problem Relation Age of Onset  . Hypotension Mother   . Hypertension Father     Allergies  Allergen Reactions  . Azithromycin Hives    Current Medications:   Current Outpatient Medications:  .  lidocaine (XYLOCAINE) 5 % ointment, Apply 1 application topically 4 (four) times daily as needed., Disp: 30 g, Rfl: 0 .  Multiple Vitamin (MULTIVITAMIN) tablet, Take 1 tablet by mouth  daily., Disp: , Rfl:  .  sulfamethoxazole-trimethoprim (BACTRIM DS) 800-160 MG tablet, Take 1 tablet by mouth 2 (two) times daily., Disp: 14 tablet, Rfl: 0 .  valACYclovir (VALTREX) 1000 MG tablet, Take 1 tablet (1,000 mg total) by mouth 2 (two) times daily. Take for 10 days, Disp: 20 tablet, Rfl: 0   Review of Systems:   ROS Negative unless otherwise specified per HPI.  Vitals:   Vitals:   06/13/20 1541  BP: 118/70  Pulse: (!) 103  Temp: 98.4 F (36.9 C)  TempSrc: Temporal  SpO2: 97%  Weight: 149 lb 8 oz (67.8 kg)  Height: 5\' 3"  (1.6 m)     Body mass index is 26.48 kg/m.  Physical Exam:   Physical Exam Vitals and nursing note reviewed.  Constitutional:      General: She is not in acute distress.    Appearance: She is well-developed. She is not ill-appearing or toxic-appearing.  Cardiovascular:     Rate and Rhythm: Normal rate and regular rhythm.     Pulses: Normal pulses.     Heart sounds: Normal heart sounds, S1 normal and S2 normal.     Comments: No LE edema Pulmonary:     Effort: Pulmonary effort is normal.     Breath sounds: Normal breath sounds.  Skin:    General: Skin is warm and dry.  Comments: Lumbar area with well demarcated mild erythema; no warmth or TTP  Neurological:     General: No focal deficit present.     Mental Status: She is alert.     GCS: GCS eye subscore is 4. GCS verbal subscore is 5. GCS motor subscore is 6.     Cranial Nerves: Cranial nerves are intact.     Sensory: Sensation is intact.     Coordination: Coordination is intact.     Gait: Gait is intact.  Psychiatric:        Speech: Speech normal.        Behavior: Behavior normal. Behavior is cooperative.     Results for orders placed or performed in visit on 06/13/20  POCT urine pregnancy  Result Value Ref Range   Preg Test, Ur Negative Negative    Assessment and Plan:   Christine Mahoney was seen today for fatigue and rash.  Diagnoses and all orders for this visit:  Fatigue,  unspecified type Urine pregnancy negative. Update labs to r/o possible contributors. Possible prodromal fatigue related to her shingles vs herpes outbreak. Follow-up based on symptoms and clinical response. -     CBC with Differential/Platelet; Future -     Comprehensive metabolic panel; Future -     TSH; Future -     VITAMIN D 25 Hydroxy (Vit-D Deficiency, Fractures); Future -     Vitamin B12; Future -     POCT urine pregnancy -     Vitamin B12 -     CBC with Differential/Platelet -     Comprehensive metabolic panel -     TSH -     VITAMIN D 25 Hydroxy (Vit-D Deficiency, Fractures)  Rash Patient reports area has significantly improved since yesterday. Continue to monitor, she has an rx for Bactrim if it worsens.  CMA or LPN served as scribe during this visit. History, Physical, and Plan performed by medical provider. The above documentation has been reviewed and is accurate and complete.  Inda Coke, PA-C

## 2020-06-13 NOTE — Patient Instructions (Signed)
It was great to see you!  I will be in touch with your lab results.  If your rash does not improve, please start the antibiotic sent in by Dr. Talbert Nan.  Take care,  Inda Coke PA-C

## 2020-06-13 NOTE — Telephone Encounter (Signed)
Spoke with pt. Pt states calling to give update to Dr Talbert Nan from [place] yesterday. Pt states lower back is less red and itchy. Pt states feels better in lower back than yesterday. Pt states blisters are very painful and used the whole tube of lidocaine Rx yesterday. Pt requesting new Lidocaine Rx refill. Advised pt to only use a small amount to blisters with Lidocaine. Pt verbalized understanding.  Pt has OV at 1 pm today with Dr Talbert Nan and also with PCP Morene Rankins, [place]. Pt wanting to know what OV to keep or cancel.  Pt aware of HSV swab not resulted yet, but will call pt when back. Pt agreeable.  Advised will review with Dr Talbert Nan and return call. Pt agreeable.   Reviewed call with Dr Talbert Nan. Ok to cancel OV follow up today with Dr Talbert Nan and pt advised to keep appt with Morene Rankins, [place]. Pt agreeable and verbalized understanding. Rx Lidocaine sent to pharmacy on file per Dr Talbert Nan # 30g tube, 0RF.   Routing to Dr Talbert Nan for review.  Encounter closed.

## 2020-06-13 NOTE — Telephone Encounter (Signed)
Patient went to see her pcp yesterday after appointment here and want to know if she need recheck appointment this afternoon with Dr Talbert Nan.

## 2020-06-14 ENCOUNTER — Other Ambulatory Visit: Payer: Self-pay | Admitting: Physician Assistant

## 2020-06-14 DIAGNOSIS — R739 Hyperglycemia, unspecified: Secondary | ICD-10-CM

## 2020-06-15 ENCOUNTER — Telehealth: Payer: Self-pay

## 2020-06-15 ENCOUNTER — Telehealth: Payer: Self-pay | Admitting: *Deleted

## 2020-06-15 LAB — HSV NAA
HSV 1 NAA: NEGATIVE
HSV 2 NAA: NEGATIVE

## 2020-06-15 NOTE — Telephone Encounter (Signed)
Patient is calling to go over results.

## 2020-06-15 NOTE — Telephone Encounter (Signed)
Spoke with pt. Pt given results and recommendations per Dr Talbert Nan. Pt states will contact S. Morene Rankins, [place] for follow up. Pt has 22 tablets left of 1000 mg Valtrex. Pt will start taking 3 tablets total today for day 1. Pt verbalized understanding.  Encounter closed  Salvadore Dom, MD  P [facility] Triage Pool Cc: Inda Coke, [place] Please let the patient know that her HSV testing is negative, this goes along with the diagnosis of shingles. Let her know that the dosing of valtrex for shingles is slightly different. She should take 1000 mg of the Valtrex 3 x a day, only for a total of 7 days. I would have her start this dosing today, ask her to count out her pills and call in any extra she needs to get her 7 days worth 3 x a day. She should f/u with Inda Coke for this.  CC: Inda Coke

## 2020-06-15 NOTE — Telephone Encounter (Signed)
Pt called to let us know that her test results from Dr. Talbert Nan came back that she has shingles and was told to follow up with [place]. Asked her if Dr. Talbert Nan gave her medication pt said yes she is going to start it but didn't know if there was follow up. Told pt make sure she takes all medication and if does not clear up to let us know. Pt verbalized understanding. Pt asked about the Shingles shot. Told pt it is not recommended till age 44. Pt said even though I got shingles? Told pt since you have shingles already the shot  it is not going to prevent you from getting it just my help not as severe outbreak. Pt verbalized understanding.

## 2020-06-16 LAB — COMPREHENSIVE METABOLIC PANEL
AG Ratio: 1.9 (calc) (ref 1.0–2.5)
ALT: 19 U/L (ref 6–29)
AST: 24 U/L (ref 10–30)
Albumin: 4.3 g/dL (ref 3.6–5.1)
Alkaline phosphatase (APISO): 72 U/L (ref 31–125)
BUN: 10 mg/dL (ref 7–25)
CO2: 23 mmol/L (ref 20–32)
Calcium: 9.3 mg/dL (ref 8.6–10.2)
Chloride: 106 mmol/L (ref 98–110)
Creat: 0.59 mg/dL (ref 0.50–1.10)
Globulin: 2.3 g/dL (calc) (ref 1.9–3.7)
Glucose, Bld: 161 mg/dL — ABNORMAL HIGH (ref 65–99)
Potassium: 3.8 mmol/L (ref 3.5–5.3)
Sodium: 139 mmol/L (ref 135–146)
Total Bilirubin: 0.3 mg/dL (ref 0.2–1.2)
Total Protein: 6.6 g/dL (ref 6.1–8.1)

## 2020-06-16 LAB — CBC WITH DIFFERENTIAL/PLATELET
Absolute Monocytes: 483 cells/uL (ref 200–950)
Basophils Absolute: 48 cells/uL (ref 0–200)
Basophils Relative: 0.7 %
Eosinophils Absolute: 221 cells/uL (ref 15–500)
Eosinophils Relative: 3.2 %
HCT: 39.9 % (ref 35.0–45.0)
Hemoglobin: 13 g/dL (ref 11.7–15.5)
Lymphs Abs: 1863 cells/uL (ref 850–3900)
MCH: 29.2 pg (ref 27.0–33.0)
MCHC: 32.6 g/dL (ref 32.0–36.0)
MCV: 89.7 fL (ref 80.0–100.0)
MPV: 10 fL (ref 7.5–12.5)
Monocytes Relative: 7 %
Neutro Abs: 4285 cells/uL (ref 1500–7800)
Neutrophils Relative %: 62.1 %
Platelets: 285 10*3/uL (ref 140–400)
RBC: 4.45 10*6/uL (ref 3.80–5.10)
RDW: 11.9 % (ref 11.0–15.0)
Total Lymphocyte: 27 %
WBC: 6.9 10*3/uL (ref 3.8–10.8)

## 2020-06-16 LAB — TEST AUTHORIZATION

## 2020-06-16 LAB — VITAMIN B12: Vitamin B-12: 947 pg/mL (ref 200–1100)

## 2020-06-16 LAB — HEMOGLOBIN A1C W/OUT EAG: Hgb A1c MFr Bld: 5.4 % of total Hgb (ref <5.7)

## 2020-06-16 LAB — VITAMIN D 25 HYDROXY (VIT D DEFICIENCY, FRACTURES): Vit D, 25-Hydroxy: 30 ng/mL (ref 30–100)

## 2020-06-16 LAB — TSH: TSH: 2.54 mIU/L

## 2020-06-20 ENCOUNTER — Telehealth: Payer: Self-pay

## 2020-06-20 NOTE — Telephone Encounter (Addendum)
[place] for patient transfer

## 2020-06-20 NOTE — Telephone Encounter (Signed)
Pt is requesting to transfer from [facility] to Dr. Jonni Sanger.

## 2020-06-20 NOTE — Telephone Encounter (Signed)
Ok with me. Thanks.  

## 2020-06-21 ENCOUNTER — Telehealth: Payer: Self-pay

## 2020-06-21 ENCOUNTER — Encounter: Payer: Self-pay | Admitting: Obstetrics and Gynecology

## 2020-06-21 NOTE — Telephone Encounter (Signed)
Pt sent following mychart message:  Zali, [place] [facility] Clinical [facility] Hello, am doing well and healing well. Iam nearing end of my lidocaine and wanted to check if I can get more lidocaine . My periods have started, and hence having lidocaine will make it easier for this time. Thanks,  Roslyn Smiling

## 2020-06-21 NOTE — Telephone Encounter (Signed)
Last Lidocaine Rx sent # 30g, 0RF on 06/13/20 Pt dx with Shingles 06/15/20   Routing to Dr Talbert Nan, please advise

## 2020-06-21 NOTE — Telephone Encounter (Signed)
Pt scheduled  

## 2020-06-21 NOTE — Telephone Encounter (Signed)
Please call the patient and speak with her. I'm worried she is using too much of the lidocaine. Lidocaine can absorbed through the skin and it can have systemic effects. I think at this point she should switch to the over the counter lidocaine.

## 2020-06-21 NOTE — Telephone Encounter (Signed)
Spoke with pt. Pt given update per Dr Talbert Nan. Pt agreeable and will use OTC Lidocaine. Pt states is getting better, spots have crusted over. Pt also advised can call PCP for follow up care. Pt verbalized understanding.  Routing to Dr Talbert Nan for update Encounter closed.

## 2020-07-03 DIAGNOSIS — M9905 Segmental and somatic dysfunction of pelvic region: Secondary | ICD-10-CM | POA: Diagnosis not present

## 2020-07-03 DIAGNOSIS — M9902 Segmental and somatic dysfunction of thoracic region: Secondary | ICD-10-CM | POA: Diagnosis not present

## 2020-07-03 DIAGNOSIS — M5431 Sciatica, right side: Secondary | ICD-10-CM | POA: Diagnosis not present

## 2020-07-03 DIAGNOSIS — M9903 Segmental and somatic dysfunction of lumbar region: Secondary | ICD-10-CM | POA: Diagnosis not present

## 2020-07-07 ENCOUNTER — Other Ambulatory Visit: Payer: Self-pay

## 2020-07-07 ENCOUNTER — Ambulatory Visit: Payer: 59 | Admitting: Family Medicine

## 2020-07-07 ENCOUNTER — Encounter: Payer: Self-pay | Admitting: Family Medicine

## 2020-07-07 VITALS — BP 102/70 | HR 92 | Temp 98.0°F | Ht 63.0 in | Wt 148.2 lb

## 2020-07-07 DIAGNOSIS — R5383 Other fatigue: Secondary | ICD-10-CM

## 2020-07-07 DIAGNOSIS — M722 Plantar fascial fibromatosis: Secondary | ICD-10-CM

## 2020-07-07 DIAGNOSIS — Z8619 Personal history of other infectious and parasitic diseases: Secondary | ICD-10-CM

## 2020-07-07 MED ORDER — DICLOFENAC SODIUM 75 MG PO TBEC: 75.0000 mg | DELAYED_RELEASE_TABLET | Freq: Two times a day (BID) | ORAL | 0 refills | Status: DC

## 2020-07-07 NOTE — Patient Instructions (Addendum)
Please return in June 2022 for your annual complete physical; please come fasting.  Take the diclofenac twice daily for the next 2-4 weeks; follow up with the orthopedist if your heel remains painful.   Get your flu shot when you can. You can also check to see when you had your last tdap. This is typically given during or right after pregnancy   It was a pleasure meeting you today! Thank you for choosing [place] to meet your healthcare needs! I truly look forward to working with you. If you have any questions or concerns, please send me a message via Mychart or call the office at [phone].

## 2020-07-07 NOTE — Progress Notes (Signed)
Subjective  CC:  Chief Complaint  Patient presents with  . Transitions Of Care  . Herpes Zoster    recent break out last month - wanting to know how to prevent break outs in the future    HPI: Christine Mahoney is a 44 y.o. female who presents to [facility] [place] today to establish care with me as a new patient.  I reviewed her recent lab test results, listed below.  Recent office visit.  Past medical history.  GYN consult.  Updated chart accordingly She has the following concerns or needs:  44 year old healthy married mother of 2 boys who currently stays at home with them.  Overall health is excellent although she is struggled a bit over the last 1 to 6 months.  I reviewed recent office note.  Had shingles last month.  Fortunately resolved.  She worries about why she had this outbreak and if she should expect another.  Her fatigue has improved overall.  She eats well.  Lab work was unremarkable for other etiologies.  She denies mood disorder.  Health maintenance is up-to-date although she is due for Pap smear and has been scheduled with GYN next month.  She declines flu shot and Tdap today due to an upcoming trip.  Tdap may be up-to-date as she has a young son.  Has been struggling with plantar fasciitis worse on the left for the last 6 months.  She has been seen by orthopedist.  She has had an MRI of the left.  She was unable to tolerate meloxicam.  This is limiting her ability to function well.  Has not been able to play tennis which is something she likes to do.  She is currently seeing a Restaurant manager, fast food.  Assessment  1. Plantar fasciitis, bilateral   2. History of shingles   3. Fatigue, unspecified type      Plan   Bilateral plantar fasciitis: Education given, ice, massage and 2 to 4-week course of diclofenac.  Recommend follow-up with orthopedist for steroid injections if not improving.  Patient agrees.  History of shingles: Possibly due to her being run down of  the last 6 months.  Reassured.  No need for anything but healthy lifestyle recommendations at this point.  Recommend immunization at age 63.  Fatigue: Reassured.  Improving  Follow up:  Return in about 8 months (around 03/07/2021) for complete physical. No orders of the defined types were placed in this encounter.  Meds ordered this encounter  Medications  . diclofenac (VOLTAREN) 75 MG EC tablet    Sig: Take 1 tablet (75 mg total) by mouth 2 (two) times daily.    Dispense:  30 tablet    Refill:  0     Depression screen PHQ 2/9 12/26/2016  Decreased Interest 0  Down, Depressed, Hopeless 0  PHQ - 2 Score 0    We updated and reviewed the patient's past history in detail and it is documented below.  Patient Active Problem List   Diagnosis Date Noted  . Plantar fasciitis, bilateral 02/23/2020   Health Maintenance  Topic Date Due  . Hepatitis C Screening  Never done  . TETANUS/TDAP  08/07/2020 (Originally 05/28/1995)  . PAP SMEAR-Modifier  10/05/2020 (Originally 11/21/2019)  . HIV Screening  Completed  . INFLUENZA VACCINE  Discontinued   Immunization History  Administered Date(s) Administered  . Influenza,inj,Quad PF,6+ Mos 08/17/2019   Current Meds  Medication Sig  . lidocaine (XYLOCAINE) 5 % ointment Apply 1  application topically 4 (four) times daily as needed.  . Multiple Vitamin (MULTIVITAMIN) tablet Take 1 tablet by mouth daily.  . valACYclovir (VALTREX) 1000 MG tablet Take 1 tablet (1,000 mg total) by mouth 2 (two) times daily. Take for 10 days    Allergies: Patient is allergic to azithromycin. Past Medical History Patient  has a past medical history of Fibroid. Past Surgical History Patient  has a past surgical history that includes Cesarean section. Family History: Patient family history includes Healthy in her brother and son; Hypertension in her father; Hypotension in her mother. Social History:  Patient  reports that she has never smoked. She has never used  smokeless tobacco. She reports that she does not drink alcohol and does not use drugs.  Review of Systems: Constitutional: negative for fever or malaise Ophthalmic: negative for photophobia, double vision or loss of vision Cardiovascular: negative for chest pain, dyspnea on exertion, or new LE swelling Respiratory: negative for SOB or persistent cough Gastrointestinal: negative for abdominal pain, change in bowel habits or melena Genitourinary: negative for dysuria or gross hematuria Musculoskeletal: negative for new gait disturbance or muscular weakness Integumentary: negative for new or persistent rashes Neurological: negative for TIA or stroke symptoms Psychiatric: negative for SI or delusions Allergic/Immunologic: negative for hives  Patient Care Team    Relationship Specialty Notifications Start End  [place], [place], [place] PCP - General Physician Assistant  12/26/16   Salvadore Dom, MD Consulting Physician Obstetrics and Gynecology  07/24/18     Objective  Vitals: BP 102/70   Pulse 92   Temp 98 F (36.7 C) (Temporal)   Ht 5\' 3"  (1.6 m)   Wt 148 lb 3.2 oz (67.2 kg)   SpO2 98%   BMI 26.25 kg/m  General:  Well developed, well nourished, no acute distress  Psych:  Alert and oriented,normal mood and affect HEENT:  Normocephalic, atraumatic, non-icteric sclera, supple neck without adenopathy, mass or thyromegaly Cardiovascular:  RRR without gallop, rub or murmur Respiratory:  Good breath sounds bilaterally, CTAB with normal respiratory effort Gastrointestinal: normal bowel sounds, soft, non-tender, no noted masses. No HSM  No visits with results within 1 Day(s) from this visit.  Latest known visit with results is:  Office Visit on 06/13/2020  Component Date Value Ref Range Status  . Preg Test, Ur 06/13/2020 Negative  Negative Final  . Vitamin B-12 06/13/2020 947  200 - 1,100 pg/mL Final  . WBC 06/13/2020 6.9  3.8 - 10.8 Thousand/uL Final  . RBC 06/13/2020 4.45  3.80 -  5.10 Million/uL Final  . Hemoglobin 06/13/2020 13.0  11.7 - 15.5 g/dL Final  . HCT 06/13/2020 39.9  35 - 45 % Final  . MCV 06/13/2020 89.7  80.0 - 100.0 fL Final  . MCH 06/13/2020 29.2  27.0 - 33.0 pg Final  . MCHC 06/13/2020 32.6  32.0 - 36.0 g/dL Final  . RDW 06/13/2020 11.9  11.0 - 15.0 % Final  . Platelets 06/13/2020 285  140 - 400 Thousand/uL Final  . MPV 06/13/2020 10.0  7.5 - 12.5 fL Final  . Neutro Abs 06/13/2020 4,285  1,500 - 7,800 cells/uL Final  . Lymphs Abs 06/13/2020 1,863  850 - 3,900 cells/uL Final  . Absolute Monocytes 06/13/2020 483  200 - 950 cells/uL Final  . Eosinophils Absolute 06/13/2020 221  15.0 - 500.0 cells/uL Final  . Basophils Absolute 06/13/2020 48  0.0 - 200.0 cells/uL Final  . Neutrophils Relative % 06/13/2020 62.1  % Final  . Total Lymphocyte  06/13/2020 27.0  % Final  . Monocytes Relative 06/13/2020 7.0  % Final  . Eosinophils Relative 06/13/2020 3.2  % Final  . Basophils Relative 06/13/2020 0.7  % Final  . Glucose, Bld 06/13/2020 161* 65 - 99 mg/dL Final  . BUN 06/13/2020 10  7 - 25 mg/dL Final  . Creat 06/13/2020 0.59  0.50 - 1.10 mg/dL Final  . BUN/Creatinine Ratio 22/97/9892 NOT APPLICABLE  6 - 22 (calc) Final  . Sodium 06/13/2020 139  135 - 146 mmol/L Final  . Potassium 06/13/2020 3.8  3.5 - 5.3 mmol/L Final  . Chloride 06/13/2020 106  98 - 110 mmol/L Final  . CO2 06/13/2020 23  20 - 32 mmol/L Final  . Calcium 06/13/2020 9.3  8.6 - 10.2 mg/dL Final  . Total Protein 06/13/2020 6.6  6.1 - 8.1 g/dL Final  . Albumin 06/13/2020 4.3  3.6 - 5.1 g/dL Final  . Globulin 06/13/2020 2.3  1.9 - 3.7 g/dL (calc) Final  . AG Ratio 06/13/2020 1.9  1.0 - 2.5 (calc) Final  . Total Bilirubin 06/13/2020 0.3  0.2 - 1.2 mg/dL Final  . Alkaline phosphatase (APISO) 06/13/2020 72  31 - 125 U/L Final  . AST 06/13/2020 24  10 - 30 U/L Final  . ALT 06/13/2020 19  6 - 29 U/L Final  . TSH 06/13/2020 2.54  mIU/L Final  . Vit D, 25-Hydroxy 06/13/2020 30  30 - 100 ng/mL Final   . Hgb A1c MFr Bld 06/13/2020 5.4  <5.7 % of total Hgb Final  . TEST NAME: 06/13/2020 HEMOGLOBIN A1c   Final  . TEST CODE: 06/13/2020 [id number]   Final  . CLIENT CONTACT: 06/13/2020 [place]   Final  . REPORT ALWAYS MESSAGE SIGNATURE 06/13/2020    Final     Commons side effects, risks, benefits, and alternatives for medications and treatment plan prescribed today were discussed, and the patient expressed understanding of the given instructions. Patient is instructed to call or message via MyChart if he/she has any questions or concerns regarding our treatment plan. No barriers to understanding were identified. We discussed Red Flag symptoms and signs in detail. Patient expressed understanding regarding what to do in case of urgent or emergency type symptoms.   Medication list was reconciled, printed and provided to the patient in AVS. Patient instructions and summary information was reviewed with the patient as documented in the AVS. This note was prepared with assistance of Dragon voice recognition software. Occasional wrong-word or sound-a-like substitutions may have occurred due to the inherent limitations of voice recognition software  This visit occurred during the SARS-CoV-2 public health emergency.  Safety protocols were in place, including screening questions prior to the visit, additional usage of staff PPE, and extensive cleaning of exam room while observing appropriate contact time as indicated for disinfecting solutions.

## 2020-08-02 NOTE — Progress Notes (Signed)
44 y.o. G2P2002 Married Asian Not Hispanic or Latino female here for annual exam.  She was seen in 9/21 with vulvar ulcerations, negative HSV testing, c/w shingles.  H/O fibroid uterus.  Period Duration (Days): 7 Period Pattern: Regular Menstrual Flow:  (random) Menstrual Control: Maxi pad, Tampon Dysmenorrhea: (!) Mild Dysmenorrhea Symptoms: Cramping  No dyspareunia.   Patient's last menstrual period was 07/19/2020 (exact date).          Sexually active: Yes.    The current method of family planning is condoms all the time.    Exercising: No.  exercise Smoker:  no  Health Maintenance: Pap:   11/20/16 negative HR HPV negative  History of abnormal Pap:  no MMG:  None  BMD:   None  Colonoscopy: none  TDaP:  unsure Gardasil: unsure   reports that she has never smoked. She has never used smokeless tobacco. She reports that she does not drink alcohol and does not use drugs. Husband is an Materials engineer with [employer]. She runs a business. They have 2 young sons   Past Medical History:  Diagnosis Date  . Fibroid     Past Surgical History:  Procedure Laterality Date  . CESAREAN SECTION     2011 and 2013    No current outpatient medications on file.   No current facility-administered medications for this visit.    Family History  Problem Relation Age of Onset  . Hypotension Mother   . Hypertension Father   . Healthy Brother   . Healthy Son   . Cancer Neg Hx   . Diabetes Neg Hx   . Heart disease Neg Hx   . Stroke Neg Hx     Review of Systems  Constitutional: Negative.   HENT: Negative.   Eyes: Negative.   Respiratory: Negative.   Cardiovascular: Negative.   Gastrointestinal: Negative.   Endocrine: Negative.   Genitourinary: Negative.   Musculoskeletal: Negative.   Skin: Negative.   Allergic/Immunologic: Negative.   Neurological: Negative.   Hematological: Negative.   Psychiatric/Behavioral: Negative.     Exam:   BP 106/76   Pulse 80   Resp 16   Ht 5' 1.25"  (1.556 m)   Wt 147 lb (66.7 kg)   LMP 07/19/2020 (Exact Date)   BMI 27.55 kg/m   Weight change: @WEIGHTCHANGE @ Height:   Height: 5' 1.25" (155.6 cm)  Ht Readings from Last 3 Encounters:  08/03/20 5' 1.25" (1.556 m)  07/07/20 5\' 3"  (1.6 m)  06/13/20 5\' 3"  (1.6 m)    General appearance: alert, cooperative and appears stated age Head: Normocephalic, without obvious abnormality, atraumatic Neck: no adenopathy, supple, symmetrical, trachea midline and thyroid normal to inspection and palpation Lungs: clear to auscultation bilaterally Cardiovascular: regular rate and rhythm Breasts: normal appearance, no masses or tenderness Abdomen: soft, non-tender; non distended,  no masses,  no organomegaly Extremities: extremities normal, atraumatic, no cyanosis or edema Skin: Skin color, texture, turgor normal. No rashes or lesions Lymph nodes: Cervical, supraclavicular, and axillary nodes normal. No abnormal inguinal nodes palpated Neurologic: Grossly normal   Pelvic: External genitalia:  no lesions              Urethra:  normal appearing urethra with no masses, tenderness or lesions              Bartholins and Skenes: normal                 Vagina: normal appearing vagina with normal color and discharge, no lesions  Cervix: no lesions               Bimanual Exam:  Uterus:  uterus is slightly enlarged, irregularly shaped c/w known fibroids, not tender.              Adnexa: no mass, fullness, tenderness               Rectovaginal: Confirms               Anus:  normal sphincter tone, no lesions  Royal Hawthorn chaperoned for the exam.  A:  Well Woman with normal exam  P:   No pap this year  Mammogram recommended  Discussed breast self exam  Discussed calcium and vit D intake  Labs UTD

## 2020-08-03 ENCOUNTER — Encounter: Payer: Self-pay | Admitting: Obstetrics and Gynecology

## 2020-08-03 ENCOUNTER — Other Ambulatory Visit: Payer: Self-pay

## 2020-08-03 ENCOUNTER — Ambulatory Visit: Payer: 59 | Admitting: Obstetrics and Gynecology

## 2020-08-03 VITALS — BP 106/76 | HR 80 | Resp 16 | Ht 61.25 in | Wt 147.0 lb

## 2020-08-03 DIAGNOSIS — Z01419 Encounter for gynecological examination (general) (routine) without abnormal findings: Secondary | ICD-10-CM

## 2020-08-03 NOTE — Patient Instructions (Addendum)
Please schedule a mammogram: [facility], [place], [place] [address] [phone]  EXERCISE   We recommended that you start or continue a regular exercise program for good health. Physical activity is anything that gets your body moving, some is better than none. The CDC recommends 150 minutes per week of Moderate-Intensity Aerobic Activity and 2 or more days of Muscle Strengthening Activity.  Benefits of exercise are limitless: helps weight loss/weight maintenance, improves mood and energy, helps with depression and anxiety, improves sleep, tones and strengthens muscles, improves balance, improves bone density, protects from chronic conditions such as heart disease, high blood pressure and diabetes and so much more. To learn more visit: WhyNotPoker.uy  DIET: Good nutrition starts with a healthy diet of fruits, vegetables, whole grains, and lean protein sources. Drink plenty of water for hydration. Minimize empty calories, sodium, sweets. For more information about dietary recommendations visit: GeekRegister.com.ee and http://schaefer-mitchell.com/  ALCOHOL:  Women should limit their alcohol intake to no more than 7 drinks/beers/glasses of wine (combined, not each!) per week. Moderation of alcohol intake to this level decreases your risk of breast cancer and liver damage.  If you are concerned that you may have a problem, or your friends have told you they are concerned about your drinking, there are many resources to help. A well-known program that is free, effective, and available to all people all over the nation is Alcoholics Anonymous.  Check out this site to learn more: BlockTaxes.se   CALCIUM AND VITAMIN D:  Adequate intake of calcium and Vitamin D are recommended for bone health.  The recommendations for exact amounts of these supplements seem to change  often, but generally speaking 1000-1500 mg of calcium (between diet and supplement) and 800 units of Vitamin D per day seems prudent.     PAP SMEARS:  Pap smears, to check for cervical cancer or precancers,  have traditionally been done yearly, although recent scientific advances have shown that most women can have pap smears less often.  However, every woman still should have a physical exam from her gynecologist every year. It will include a breast check, inspection of the vulva and vagina to check for abnormal growths or skin changes, a visual exam of the cervix, and then an exam to evaluate the size and shape of the uterus and ovaries.  And after 44 years of age, a rectal exam is indicated to check for rectal cancers. We will also provide age appropriate advice regarding health maintenance, like when you should have certain vaccines, screening for sexually transmitted diseases, bone density testing, colonoscopy, mammograms, etc.   MAMMOGRAMS:  All women over 49 years old should have a routine mammogram.   COLON CANCER SCREENING: Now recommend starting at age 59. At this time colonoscopy is not covered for routine screening until 50. There are take home tests that can be done between 45-49.   COLONOSCOPY:  Colonoscopy to screen for colon cancer is recommended for all women at age 35.  We know, you hate the idea of the prep.  We agree, BUT, having colon cancer and not knowing it is worse!!  Colon cancer so often starts as a polyp that can be seen and removed at colonscopy, which can quite literally save your life!  And if your first colonoscopy is normal and you have no family history of colon cancer, most women don't have to have it again for 10 years.  Once every ten years, you can do  something that may end up saving your life, right?  We will be happy to help you get it scheduled when you are ready.  Be sure to check your insurance coverage so you understand how much it will cost.  It may be covered as  a preventative service at no cost, but you should check your particular policy.      Breast Self-Awareness Breast self-awareness means being familiar with how your breasts look and feel. It involves checking your breasts regularly and reporting any changes to your health care provider. Practicing breast self-awareness is important. A change in your breasts can be a sign of a serious medical problem. Being familiar with how your breasts look and feel allows you to find any problems early, when treatment is more likely to be successful. All women should practice breast self-awareness, including women who have had breast implants. How to do a breast self-exam One way to learn what is normal for your breasts and whether your breasts are changing is to do a breast self-exam. To do a breast self-exam: Look for Changes  1. Remove all the clothing above your waist. 2. Stand in front of a mirror in a room with good lighting. 3. Put your hands on your hips. 4. Push your hands firmly downward. 5. Compare your breasts in the mirror. Look for differences between them (asymmetry), such as: ? Differences in shape. ? Differences in size. ? Puckers, dips, and bumps in one breast and not the other. 6. Look at each breast for changes in your skin, such as: ? Redness. ? Scaly areas. 7. Look for changes in your nipples, such as: ? Discharge. ? Bleeding. ? Dimpling. ? Redness. ? A change in position. Feel for Changes Carefully feel your breasts for lumps and changes. It is best to do this while lying on your back on the floor and again while sitting or standing in the shower or tub with soapy water on your skin. Feel each breast in the following way:  Place the arm on the side of the breast you are examining above your head.  Feel your breast with the other hand.  Start in the nipple area and make  inch (2 cm) overlapping circles to feel your breast. Use the pads of your three middle fingers to do  this. Apply light pressure, then medium pressure, then firm pressure. The light pressure will allow you to feel the tissue closest to the skin. The medium pressure will allow you to feel the tissue that is a little deeper. The firm pressure will allow you to feel the tissue close to the ribs.  Continue the overlapping circles, moving downward over the breast until you feel your ribs below your breast.  Move one finger-width toward the center of the body. Continue to use the  inch (2 cm) overlapping circles to feel your breast as you move slowly up toward your collarbone.  Continue the up and down exam using all three pressures until you reach your armpit.  Write Down What You Find  Write down what is normal for each breast and any changes that you find. Keep a written record with breast changes or normal findings for each breast. By writing this information down, you do not need to depend only on memory for size, tenderness, or location. Write down where you are in your menstrual cycle, if you are still menstruating. If you are having trouble noticing differences in your breasts, do not get discouraged. With time you  will become more familiar with the variations in your breasts and more comfortable with the exam. How often should I examine my breasts? Examine your breasts every month. If you are breastfeeding, the best time to examine your breasts is after a feeding or after using a breast pump. If you menstruate, the best time to examine your breasts is 5-7 days after your period is over. During your period, your breasts are lumpier, and it may be more difficult to notice changes. When should I see my health care provider? See your health care provider if you notice:  A change in shape or size of your breasts or nipples.  A change in the skin of your breast or nipples, such as a reddened or scaly area.  Unusual discharge from your nipples.  A lump or thick area that was not there  before.  Pain in your breasts.  Anything that concerns you.

## 2020-08-04 DIAGNOSIS — M722 Plantar fascial fibromatosis: Secondary | ICD-10-CM | POA: Diagnosis not present

## 2020-08-21 DIAGNOSIS — M722 Plantar fascial fibromatosis: Secondary | ICD-10-CM | POA: Diagnosis not present

## 2020-08-24 DIAGNOSIS — M722 Plantar fascial fibromatosis: Secondary | ICD-10-CM | POA: Diagnosis not present

## 2020-08-24 DIAGNOSIS — R2689 Other abnormalities of gait and mobility: Secondary | ICD-10-CM | POA: Diagnosis not present

## 2020-08-24 DIAGNOSIS — M79672 Pain in left foot: Secondary | ICD-10-CM | POA: Diagnosis not present

## 2020-08-24 DIAGNOSIS — M79671 Pain in right foot: Secondary | ICD-10-CM | POA: Diagnosis not present

## 2020-08-29 DIAGNOSIS — M79672 Pain in left foot: Secondary | ICD-10-CM | POA: Diagnosis not present

## 2020-08-29 DIAGNOSIS — M79671 Pain in right foot: Secondary | ICD-10-CM | POA: Diagnosis not present

## 2020-08-29 DIAGNOSIS — R2689 Other abnormalities of gait and mobility: Secondary | ICD-10-CM | POA: Diagnosis not present

## 2020-08-29 DIAGNOSIS — M722 Plantar fascial fibromatosis: Secondary | ICD-10-CM | POA: Diagnosis not present

## 2020-09-01 DIAGNOSIS — M79671 Pain in right foot: Secondary | ICD-10-CM | POA: Diagnosis not present

## 2020-09-01 DIAGNOSIS — M722 Plantar fascial fibromatosis: Secondary | ICD-10-CM | POA: Diagnosis not present

## 2020-09-01 DIAGNOSIS — R2689 Other abnormalities of gait and mobility: Secondary | ICD-10-CM | POA: Diagnosis not present

## 2020-09-01 DIAGNOSIS — M79672 Pain in left foot: Secondary | ICD-10-CM | POA: Diagnosis not present

## 2020-09-04 DIAGNOSIS — R2689 Other abnormalities of gait and mobility: Secondary | ICD-10-CM | POA: Diagnosis not present

## 2020-09-04 DIAGNOSIS — M79672 Pain in left foot: Secondary | ICD-10-CM | POA: Diagnosis not present

## 2020-09-04 DIAGNOSIS — M79671 Pain in right foot: Secondary | ICD-10-CM | POA: Diagnosis not present

## 2020-09-04 DIAGNOSIS — M722 Plantar fascial fibromatosis: Secondary | ICD-10-CM | POA: Diagnosis not present

## 2020-09-06 DIAGNOSIS — M79671 Pain in right foot: Secondary | ICD-10-CM | POA: Diagnosis not present

## 2020-09-06 DIAGNOSIS — M722 Plantar fascial fibromatosis: Secondary | ICD-10-CM | POA: Diagnosis not present

## 2020-09-06 DIAGNOSIS — R2689 Other abnormalities of gait and mobility: Secondary | ICD-10-CM | POA: Diagnosis not present

## 2020-09-06 DIAGNOSIS — M79672 Pain in left foot: Secondary | ICD-10-CM | POA: Diagnosis not present

## 2020-09-18 DIAGNOSIS — M722 Plantar fascial fibromatosis: Secondary | ICD-10-CM | POA: Diagnosis not present

## 2020-09-22 DIAGNOSIS — M722 Plantar fascial fibromatosis: Secondary | ICD-10-CM | POA: Diagnosis not present

## 2020-10-06 DIAGNOSIS — M722 Plantar fascial fibromatosis: Secondary | ICD-10-CM | POA: Diagnosis not present

## 2020-10-13 DIAGNOSIS — M722 Plantar fascial fibromatosis: Secondary | ICD-10-CM | POA: Diagnosis not present

## 2020-10-24 DIAGNOSIS — M722 Plantar fascial fibromatosis: Secondary | ICD-10-CM | POA: Diagnosis not present

## 2020-10-26 DIAGNOSIS — M722 Plantar fascial fibromatosis: Secondary | ICD-10-CM | POA: Diagnosis not present

## 2020-10-30 DIAGNOSIS — M722 Plantar fascial fibromatosis: Secondary | ICD-10-CM | POA: Diagnosis not present

## 2020-11-04 IMAGING — DX DG HAND COMPLETE 3+V*R*
3 series · 3 of 3 positions shown · non-contrast
Comparison: None.

CLINICAL DATA: Fall with right hand pain.  Initial encounter.

EXAM:
RIGHT HAND - COMPLETE 3+ VIEW

[hand pa]
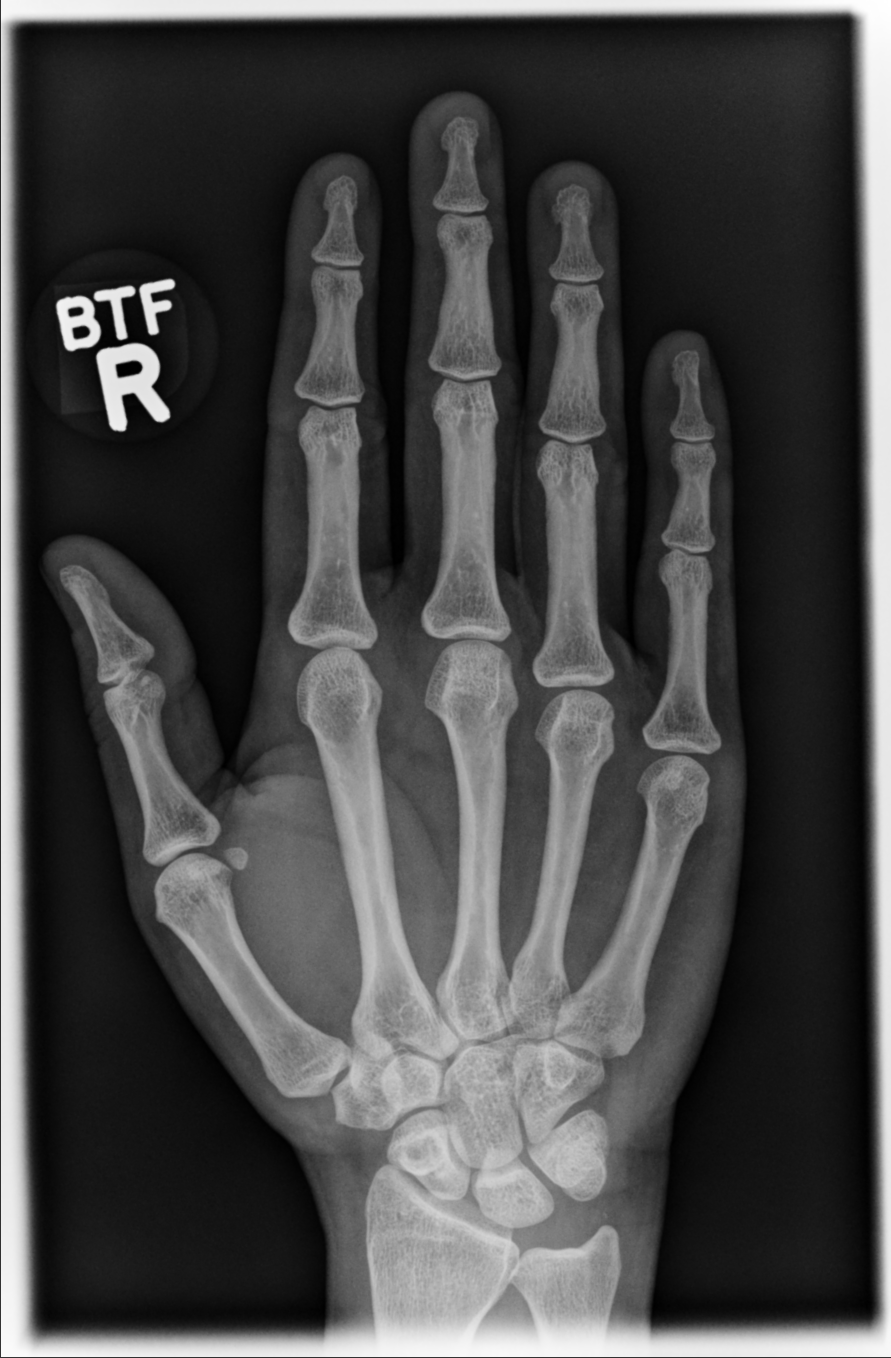

[hand oblique]
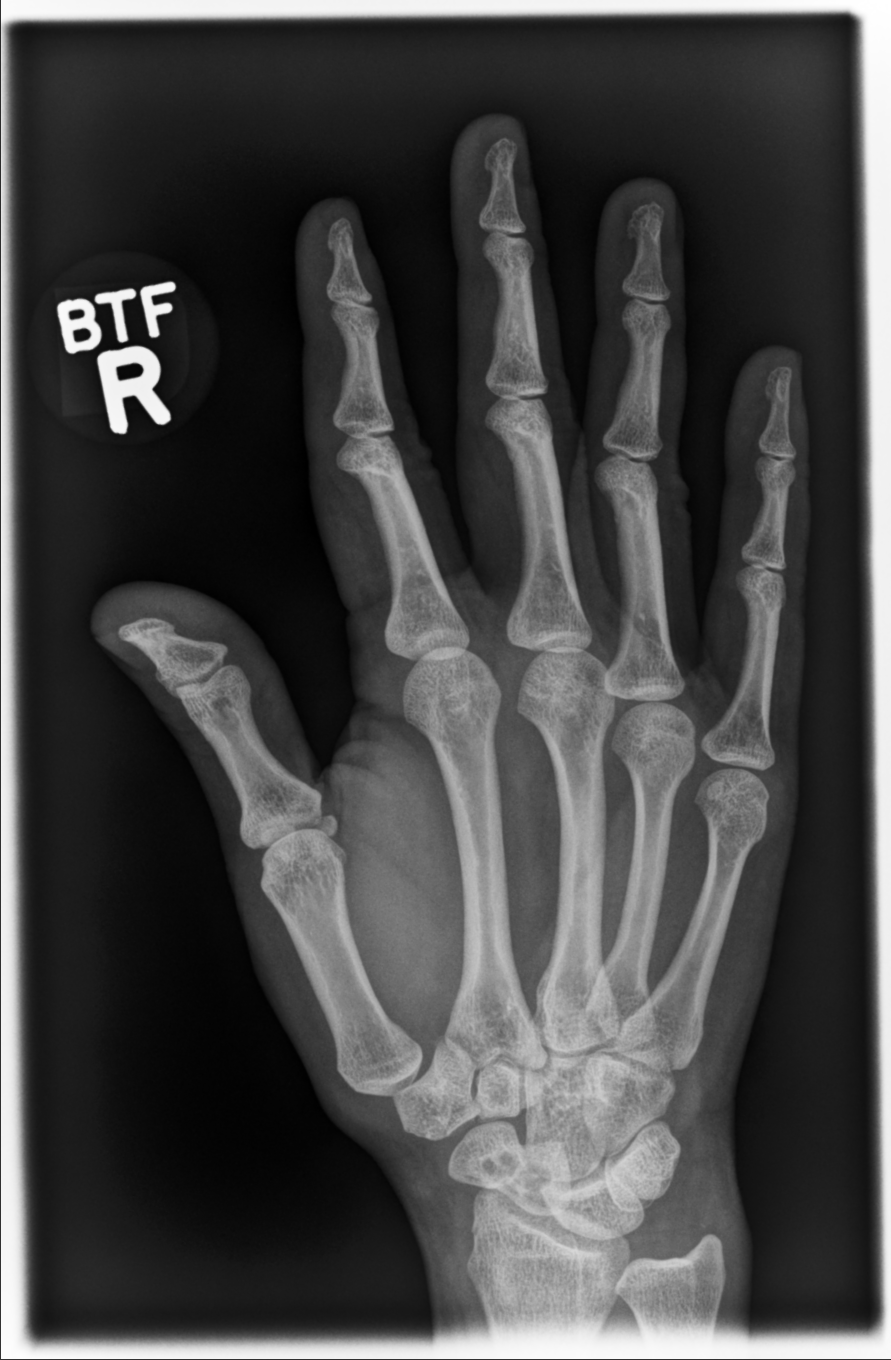

[hand lat]
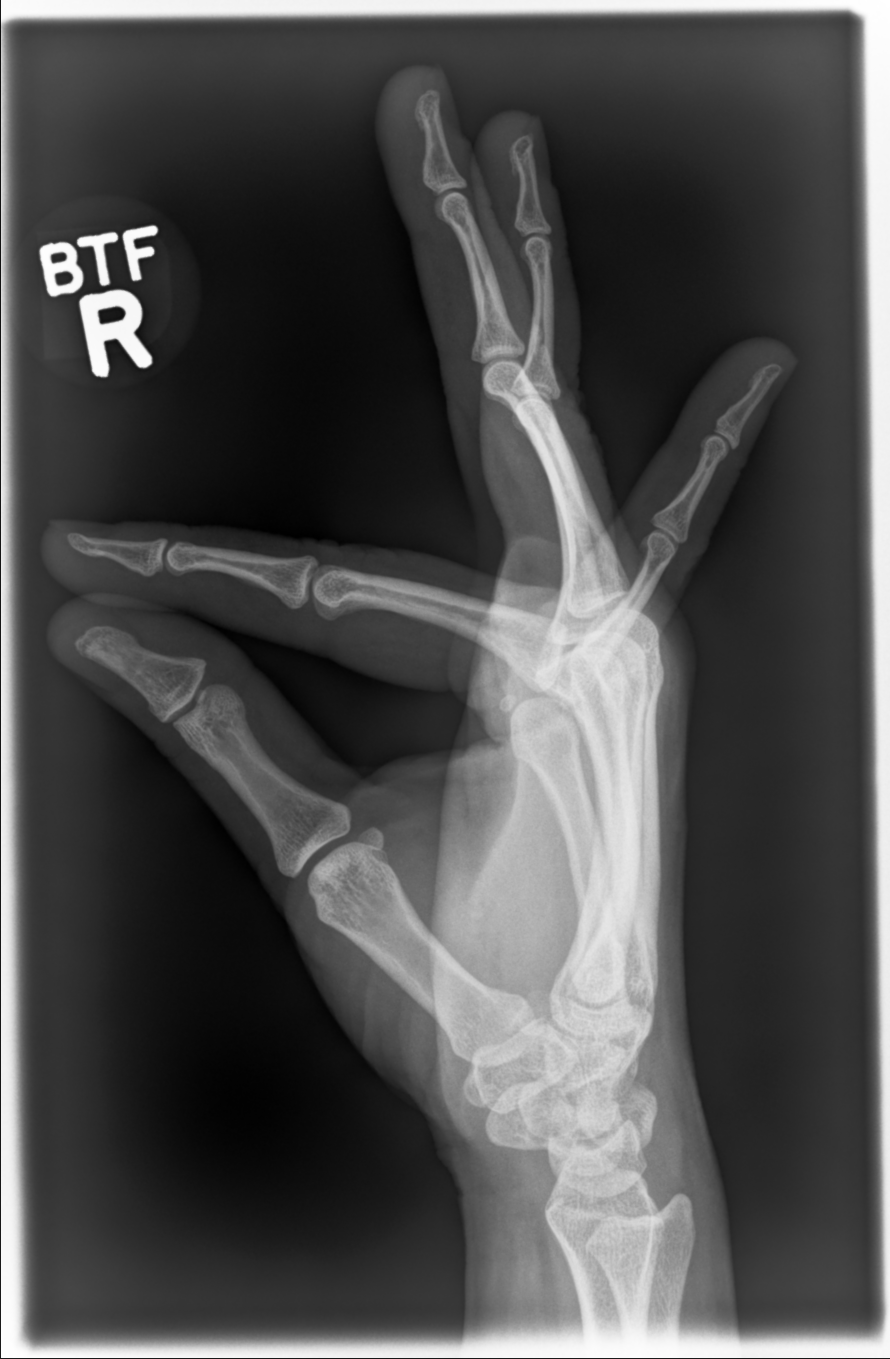

[3 of 3 positions shown; findings below may reference images not displayed]

FINDINGS: There is no evidence of fracture or dislocation. Cystic
change/ganglion at the lunate, often seen and considered incidental.
There is no evidence of arthropathy or other focal bone abnormality.
Soft tissues are unremarkable.
IMPRESSION: Negative for fracture or malalignment.

## 2020-11-06 DIAGNOSIS — M722 Plantar fascial fibromatosis: Secondary | ICD-10-CM | POA: Diagnosis not present

## 2020-11-14 DIAGNOSIS — M722 Plantar fascial fibromatosis: Secondary | ICD-10-CM | POA: Diagnosis not present

## 2020-11-16 DIAGNOSIS — M722 Plantar fascial fibromatosis: Secondary | ICD-10-CM | POA: Diagnosis not present

## 2020-11-20 DIAGNOSIS — M722 Plantar fascial fibromatosis: Secondary | ICD-10-CM | POA: Diagnosis not present

## 2020-11-23 DIAGNOSIS — M722 Plantar fascial fibromatosis: Secondary | ICD-10-CM | POA: Diagnosis not present

## 2020-11-27 DIAGNOSIS — M722 Plantar fascial fibromatosis: Secondary | ICD-10-CM | POA: Diagnosis not present

## 2020-12-01 DIAGNOSIS — M722 Plantar fascial fibromatosis: Secondary | ICD-10-CM | POA: Diagnosis not present

## 2020-12-04 DIAGNOSIS — M722 Plantar fascial fibromatosis: Secondary | ICD-10-CM | POA: Diagnosis not present

## 2020-12-08 DIAGNOSIS — M722 Plantar fascial fibromatosis: Secondary | ICD-10-CM | POA: Diagnosis not present

## 2020-12-11 ENCOUNTER — Other Ambulatory Visit: Payer: Self-pay

## 2020-12-11 ENCOUNTER — Encounter: Payer: Self-pay | Admitting: Family Medicine

## 2020-12-11 ENCOUNTER — Ambulatory Visit: Payer: 59 | Admitting: Family Medicine

## 2020-12-11 VITALS — BP 118/70 | HR 97 | Temp 98.3°F | Ht 61.0 in | Wt 147.2 lb

## 2020-12-11 DIAGNOSIS — M722 Plantar fascial fibromatosis: Secondary | ICD-10-CM | POA: Diagnosis not present

## 2020-12-11 DIAGNOSIS — I83813 Varicose veins of bilateral lower extremities with pain: Secondary | ICD-10-CM | POA: Diagnosis not present

## 2020-12-11 NOTE — Patient Instructions (Signed)
Please return in June for your annual complete physical; please come fasting.   If you have any questions or concerns, please don't hesitate to send me a message via MyChart or call the office at [phone]. Thank you for visiting with [place] today! It's our pleasure caring for you.   Varicose Veins Varicose veins are veins that have become enlarged, bulged, and twisted. They most often appear in the legs. What are the causes? This condition is caused by damage to the valves in the vein. These valves help blood return to your heart. When they are damaged and they stop working properly, blood may flow backward and back up in the veins near the skin, causing the veins to get larger and appear twisted. The condition can result from any issue that causes blood to back up, like pregnancy, prolonged standing, or obesity. What increases the risk? This condition is more likely to develop in people who are:  On their feet a lot.  Pregnant.  Overweight. What are the signs or symptoms? Symptoms of this condition include:  Bulging, twisted, and bluish veins.  A feeling of heaviness. This may be worse at the end of the day.  Leg pain. This may be worse at the end of the day.  Swelling in the leg.  Changes in skin color over the veins. How is this diagnosed? This condition may be diagnosed based on your symptoms, a physical exam, and an ultrasound test. How is this treated? Treatment for this condition may involve:  Avoiding sitting or standing in one position for long periods of time.  Wearing compression stockings. These stockings help to prevent blood clots and reduce swelling in the legs.  Raising (elevating) the legs when resting.  Losing weight.  Exercising regularly. If you have persistent symptoms or want to improve the way your varicose veins look, you may choose to have a procedure to close the varicose veins off or to remove them. Treatments to close off the veins  include:  Sclerotherapy. In this treatment, a solution is injected into a vein to close it off.  Laser treatment. In this treatment, the vein is heated with a laser to close it off.  Radiofrequency vein ablation. In this treatment, an electrical current produced by radio waves is used to close off the vein. Treatments to remove the veins include:  Phlebectomy. In this treatment, the veins are removed through small incisions made over the veins.  Vein ligation and stripping. In this treatment, incisions are made over the veins. The veins are then removed after being tied (ligated) with stitches (sutures). Follow these instructions at home: Activity  Walk as much as possible. Walking increases blood flow. This helps blood return to the heart and takes pressure off your veins. It also increases your cardiovascular strength.  Follow your health care provider's instructions about exercising.  Do not stand or sit in one position for a long period of time.  Do not sit with your legs crossed.  Rest with your legs raised during the day. General instructions  Follow any diet instructions given to you by your health care provider.  Wear compression stockings as directed by your health care provider. Do not wear other kinds of tight clothing around your legs, pelvis, or waist.  Elevate your legs at night to above the level of your heart.  If you get a cut in the skin over the varicose vein and the vein bleeds: ? Lie down with your leg raised. ? Apply firm  pressure to the cut with a clean cloth until the bleeding stops. ? Place a bandage (dressing) on the cut.   Contact a health care provider if:  The skin around your varicose veins starts to break down.  You have pain, redness, tenderness, or hard swelling over a vein.  You are uncomfortable because of pain.  You get a cut in the skin over a varicose vein and it will not stop bleeding. Summary  Varicose veins are veins that have  become enlarged, bulged, and twisted. They most often appear in the legs.  This condition is caused by damage to the valves in the vein. These valves help blood return to your heart.  Treatment for this condition includes frequent movements, wearing compression stockings, losing weight, and exercising regularly. In some cases, procedures are done to close off or remove the veins.  Treatment for this condition may include wearing compression stockings, elevating the legs, losing weight, and engaging in regular activity. In some cases, procedures are done to close off or remove the veins. This information is not intended to replace advice given to you by your health care provider. Make sure you discuss any questions you have with your health care provider. Document Revised: 01/13/2020 Document Reviewed: 01/13/2020 Elsevier Patient Education  [place].

## 2020-12-11 NOTE — Progress Notes (Signed)
Subjective  CC:  Chief Complaint  Patient presents with  . muscle tightness    Been going on for over a year, has been to PT, just "wanting to make sure everything is okay"     HPI: Christine Mahoney is a 45 y.o. female who presents to the office today to address the problems listed above in the chief complaint.  45 year old female with recalcitrant bilateral plantar fasciitis.  Being treated by physical therapy and orthopedics.  She was told that she has tight muscles and heel cords and wants to make sure that there are no other reasons for her to have this.  She denies leg pain.  She also has noticed large veins in her legs and these are at times tender.  No redness or warmth.  No calf pain.   Assessment  1. Plantar fasciitis, bilateral   2. Varicose veins of bilateral lower extremities with pain      Plan   Plantar fasciitis and varicose veins: Educated.  No muscle condition identified.  Recommend stretching continue with physical therapy.  Recommend following up with orthopedics to see what else can be done for her plantar fasciitis.  Advil if needed for varicose vein pain.  No inflammation at this time.  Reassured  Follow up: Complete physical in 3 months Visit date not found  No orders of the defined types were placed in this encounter.  No orders of the defined types were placed in this encounter.     I reviewed the patients updated PMH, FH, and SocHx.    Patient Active Problem List   Diagnosis Date Noted  . Plantar fasciitis, bilateral 02/23/2020   No outpatient medications have been marked as taking for the 12/11/20 encounter (Office Visit) with Leamon Arnt, MD.    Allergies: Patient is allergic to azithromycin. Family History: Patient family history includes Healthy in her brother and son; Hypertension in her father; Hypotension in her mother. Social History:  Patient  reports that she has never smoked. She has never used smokeless tobacco. She reports that  she does not drink alcohol and does not use drugs.  Review of Systems: Constitutional: Negative for fever malaise or anorexia Cardiovascular: negative for chest pain Respiratory: negative for SOB or persistent cough Gastrointestinal: negative for abdominal pain  Objective  Vitals: BP 118/70   Pulse 97   Temp 98.3 F (36.8 C) (Temporal)   Ht 5\' 1"  (1.549 m)   Wt 147 lb 3.2 oz (66.8 kg)   SpO2 98%   BMI 27.81 kg/m  General: no acute distress , A&Ox3 Bilateral lower extremities: No muscle tenderness, no calf tenderness, no cords, palpable mildly tender varicose veins bilateral legs.  Tender heels.  Normal gait.  No edema     Commons side effects, risks, benefits, and alternatives for medications and treatment plan prescribed today were discussed, and the patient expressed understanding of the given instructions. Patient is instructed to call or message via MyChart if he/she has any questions or concerns regarding our treatment plan. No barriers to understanding were identified. We discussed Red Flag symptoms and signs in detail. Patient expressed understanding regarding what to do in case of urgent or emergency type symptoms.   Medication list was reconciled, printed and provided to the patient in AVS. Patient instructions and summary information was reviewed with the patient as documented in the AVS. This note was prepared with assistance of Dragon voice recognition software. Occasional wrong-word or sound-a-like substitutions may have occurred due  to the inherent limitations of voice recognition software  This visit occurred during the SARS-CoV-2 public health emergency.  Safety protocols were in place, including screening questions prior to the visit, additional usage of staff PPE, and extensive cleaning of exam room while observing appropriate contact time as indicated for disinfecting solutions.

## 2020-12-14 DIAGNOSIS — M722 Plantar fascial fibromatosis: Secondary | ICD-10-CM | POA: Diagnosis not present

## 2020-12-21 DIAGNOSIS — M722 Plantar fascial fibromatosis: Secondary | ICD-10-CM | POA: Diagnosis not present

## 2020-12-28 DIAGNOSIS — M722 Plantar fascial fibromatosis: Secondary | ICD-10-CM | POA: Diagnosis not present

## 2021-08-13 NOTE — Progress Notes (Signed)
45 y.o. [id number] Married Asian Not Hispanic or Latino female here for annual exam.  No dyspareunia.  H/O fibroid uterus. Period Cycle (Days): 28 Period Duration (Days): 5-6 Period Pattern: Regular Menstrual Flow: Heavy Menstrual Control: Tampon, Maxi pad Menstrual Control Change Freq (Hours): 2-3 Dysmenorrhea: None  Patient's last menstrual period was 08/08/2021.          Sexually active: Yes.    The current method of family planning is condoms most of the time.    Exercising: Yes.     Walking and strength training  Smoker:  no  Health Maintenance: Pap:   11/20/16 negative HR HPV negative  History of abnormal Pap:  no MMG:  none  BMD:    none  Colonoscopy: none  TDaP:  unsure  Gardasil: none    reports that she has never smoked. She has never used smokeless tobacco. She reports that she does not drink alcohol and does not use drugs. Rare ETOH. Husband is an Materials engineer with [employer]. She runs a business. They have 2 young sons, 19 and 18.    Past Medical History:  Diagnosis Date   Fibroid     Past Surgical History:  Procedure Laterality Date   CESAREAN SECTION     2011 and 2013    Current Outpatient Medications  Medication Sig Dispense Refill   B Complex Vitamins (VITAMIN B COMPLEX PO) Take by mouth.     VITAMIN D PO Take by mouth.     No current facility-administered medications for this visit.    Family History  Problem Relation Age of Onset   Hypotension Mother    Hypertension Father    Healthy Brother    Healthy Son    Cancer Neg Hx    Diabetes Neg Hx    Heart disease Neg Hx    Stroke Neg Hx     Review of Systems  All other systems reviewed and are negative.  Exam:   BP 120/68   Pulse 71   Ht 5' 1.5" (1.562 m)   Wt 144 lb (65.3 kg)   LMP 08/08/2021   SpO2 100%   BMI 26.77 kg/m   Weight change: @WEIGHTCHANGE @ Height:   Height: 5' 1.5" (156.2 cm)  Ht Readings from Last 3 Encounters:  08/16/21 5' 1.5" (1.562 m)  12/11/20 5\' 1"  (1.549 m)  08/03/20 5'  1.25" (1.556 m)    General appearance: alert, cooperative and appears stated age Head: Normocephalic, without obvious abnormality, atraumatic Neck: no adenopathy, supple, symmetrical, trachea midline and thyroid normal to inspection and palpation Lungs: clear to auscultation bilaterally Cardiovascular: regular rate and rhythm Breasts: normal appearance, no masses or tenderness Abdomen: soft, non-tender; non distended,  no masses,  no organomegaly Extremities: extremities normal, atraumatic, no cyanosis or edema Skin: Skin color, texture, turgor normal. No rashes or lesions Lymph nodes: Cervical, supraclavicular, and axillary nodes normal. No abnormal inguinal nodes palpated Neurologic: Grossly normal   Pelvic: External genitalia:  no lesions              Urethra:  normal appearing urethra with no masses, tenderness or lesions              Bartholins and Skenes: normal                 Vagina: normal appearing vagina with normal color and discharge, no lesions              Cervix: no lesions  Bimanual Exam:  Uterus:   anteverted, mobile, not tender, slightly enlarged and irregular (c/w known fibroids)              Adnexa: no mass, fullness, tenderness               Rectovaginal: Confirms               Anus:  normal sphincter tone, no lesions  Marisa Sprinkles chaperoned for the exam.  1. Well woman exam Discussed breast self exam Discussed calcium and vit D intake Mammogram encouraged, discussed reasoning for the mammogram, discussed baseline risk of breast cancer. She will discuss with her husband, # given to schedule.  2. Screening for cervical cancer - Cytology - PAP  3. Colon cancer screening Recommended screening, options reviewed, she declines  4. Laboratory exam ordered as part of routine general medical examination - CBC - Comprehensive metabolic panel - Lipid panel  5. Vitamin D deficiency - VITAMIN D 25 Hydroxy (Vit-D Deficiency, Fractures)  6.  Vegetarian - Vitamin B12

## 2021-08-16 ENCOUNTER — Other Ambulatory Visit (HOSPITAL_COMMUNITY)
Admission: RE | Admit: 2021-08-16 | Discharge: 2021-08-16 | Disposition: A | Discharge status: Home / Self Care | Payer: 59 | Source: Ambulatory Visit | Attending: Obstetrics and Gynecology | Admitting: Obstetrics and Gynecology

## 2021-08-16 ENCOUNTER — Other Ambulatory Visit: Payer: Self-pay

## 2021-08-16 ENCOUNTER — Encounter: Payer: Self-pay | Admitting: Obstetrics and Gynecology

## 2021-08-16 ENCOUNTER — Ambulatory Visit (INDEPENDENT_AMBULATORY_CARE_PROVIDER_SITE_OTHER): Payer: 59 | Admitting: Obstetrics and Gynecology

## 2021-08-16 VITALS — BP 120/68 | HR 71 | Ht 61.5 in | Wt 144.0 lb

## 2021-08-16 DIAGNOSIS — Z01419 Encounter for gynecological examination (general) (routine) without abnormal findings: Secondary | ICD-10-CM

## 2021-08-16 DIAGNOSIS — E559 Vitamin D deficiency, unspecified: Secondary | ICD-10-CM | POA: Diagnosis not present

## 2021-08-16 DIAGNOSIS — Z789 Other specified health status: Secondary | ICD-10-CM | POA: Diagnosis not present

## 2021-08-16 DIAGNOSIS — Z0000 Encounter for general adult medical examination without abnormal findings: Secondary | ICD-10-CM | POA: Diagnosis not present

## 2021-08-16 DIAGNOSIS — Z124 Encounter for screening for malignant neoplasm of cervix: Secondary | ICD-10-CM

## 2021-08-16 DIAGNOSIS — Z1211 Encounter for screening for malignant neoplasm of colon: Secondary | ICD-10-CM

## 2021-08-16 NOTE — Patient Instructions (Signed)

## 2021-08-17 ENCOUNTER — Other Ambulatory Visit: Payer: Self-pay

## 2021-08-17 LAB — CYTOLOGY - PAP
Comment: NEGATIVE
Diagnosis: NEGATIVE
High risk HPV: NEGATIVE

## 2021-08-17 NOTE — Progress Notes (Unsigned)
Opened in error

## 2021-08-18 ENCOUNTER — Encounter: Payer: Self-pay | Admitting: Obstetrics and Gynecology

## 2021-08-18 LAB — COMPREHENSIVE METABOLIC PANEL
AG Ratio: 1.7 (calc) (ref 1.0–2.5)
ALT: 15 U/L (ref 6–29)
AST: 19 U/L (ref 10–35)
Albumin: 4.4 g/dL (ref 3.6–5.1)
Alkaline phosphatase (APISO): 72 U/L (ref 31–125)
BUN: 11 mg/dL (ref 7–25)
CO2: 30 mmol/L (ref 20–32)
Calcium: 9.5 mg/dL (ref 8.6–10.2)
Chloride: 104 mmol/L (ref 98–110)
Creat: 0.56 mg/dL (ref 0.50–0.99)
Globulin: 2.6 g/dL (calc) (ref 1.9–3.7)
Glucose, Bld: 80 mg/dL (ref 65–99)
Potassium: 4.7 mmol/L (ref 3.5–5.3)
Sodium: 140 mmol/L (ref 135–146)
Total Bilirubin: 0.3 mg/dL (ref 0.2–1.2)
Total Protein: 7 g/dL (ref 6.1–8.1)

## 2021-08-18 LAB — CBC
HCT: 35.9 % (ref 35.0–45.0)
Hemoglobin: 11.6 g/dL — ABNORMAL LOW (ref 11.7–15.5)
MCH: 26.5 pg — ABNORMAL LOW (ref 27.0–33.0)
MCHC: 32.3 g/dL (ref 32.0–36.0)
MCV: 82 fL (ref 80.0–100.0)
MPV: 10.3 fL (ref 7.5–12.5)
Platelets: 383 10*3/uL (ref 140–400)
RBC: 4.38 10*6/uL (ref 3.80–5.10)
RDW: 13.4 % (ref 11.0–15.0)
WBC: 6.6 10*3/uL (ref 3.8–10.8)

## 2021-08-18 LAB — FERRITIN: Ferritin: 6 ng/mL — ABNORMAL LOW (ref 16–232)

## 2021-08-18 LAB — LIPID PANEL
Cholesterol: 228 mg/dL — ABNORMAL HIGH (ref <200)
HDL: 66 mg/dL (ref >=50)
LDL Cholesterol (Calc): 126 mg/dL (calc) — ABNORMAL HIGH
Non-HDL Cholesterol (Calc): 162 mg/dL (calc) — ABNORMAL HIGH (ref <130)
Total CHOL/HDL Ratio: 3.5 (calc) (ref <5.0)
Triglycerides: 214 mg/dL — ABNORMAL HIGH (ref <150)

## 2021-08-18 LAB — VITAMIN D 25 HYDROXY (VIT D DEFICIENCY, FRACTURES): Vit D, 25-Hydroxy: 29 ng/mL — ABNORMAL LOW (ref 30–100)

## 2021-08-18 LAB — VITAMIN B12: Vitamin B-12: 608 pg/mL (ref 200–1100)

## 2021-08-20 NOTE — Telephone Encounter (Signed)
Dr. Talbert Nan do you still want her to repeat in 3 months or sooner?

## 2021-08-21 ENCOUNTER — Ambulatory Visit: Payer: Self-pay | Attending: Internal Medicine

## 2021-08-21 ENCOUNTER — Other Ambulatory Visit: Payer: Self-pay

## 2021-08-21 ENCOUNTER — Other Ambulatory Visit (HOSPITAL_BASED_OUTPATIENT_CLINIC_OR_DEPARTMENT_OTHER): Payer: Self-pay

## 2021-08-21 ENCOUNTER — Telehealth: Payer: Self-pay

## 2021-08-21 DIAGNOSIS — N920 Excessive and frequent menstruation with regular cycle: Secondary | ICD-10-CM

## 2021-08-21 DIAGNOSIS — Z23 Encounter for immunization: Secondary | ICD-10-CM

## 2021-08-21 DIAGNOSIS — D62 Acute posthemorrhagic anemia: Secondary | ICD-10-CM

## 2021-08-21 DIAGNOSIS — D500 Iron deficiency anemia secondary to blood loss (chronic): Secondary | ICD-10-CM

## 2021-08-21 MED ORDER — PFIZER COVID-19 VAC BIVALENT 30 MCG/0.3ML IM SUSP
INTRAMUSCULAR | 0 refills | Status: DC
  Filled 2021-08-21: qty 0.3, 1d supply, fill #0

## 2021-08-21 NOTE — Telephone Encounter (Signed)
RESULT NOTE "Please let the patient know that her iron stores are very low. Her cycles don't sound excessively heavy, but ask if they are heavier than they have been. If they are heavier, she should return for another ultrasound."  Patient did say for a few months Day 2 and 3 of menses are very heavy.  I sent message to appt desk to schedule u/s. They offered patient end of Dec appt but she wants to come sooner. Appt desk asking if they can schedule her for U/S only (without MD visit)?

## 2021-08-21 NOTE — Telephone Encounter (Signed)
She can try cutting out the egg yolks, then f/u in 3 months for a fasting lipid panel.

## 2021-08-21 NOTE — Telephone Encounter (Signed)
Patient advised. Reminded of instruction from Dr. Talbert Nan to increase what she is currently taking (2000 ius) by 800 IUs daily (long term).

## 2021-08-21 NOTE — Telephone Encounter (Signed)
No, 50,000 IU is way too high of a dose for her.

## 2021-08-21 NOTE — Telephone Encounter (Signed)
Yes, ultrasound only.

## 2021-08-21 NOTE — Progress Notes (Signed)
   Covid-19 Vaccination Clinic  Name:  Christine Mahoney    MRN: [id number] DOB: 07/25/1976  08/21/2021  Ms. Christine Mahoney was observed post Covid-19 immunization for 15 minutes without incident. She was provided with Vaccine Information Sheet and instruction to access the V-Safe system.   Ms. Christine Mahoney was instructed to call 911 with any severe reactions post vaccine: Difficulty breathing  Swelling of face and throat  A fast heartbeat  A bad rash all over body  Dizziness and weakness   Immunizations Administered     Name Date Dose VIS Date Route   Pfizer Covid-19 Vaccine Bivalent Booster 08/21/2021 11:36 AM 0.3 mL 05/16/2021 Intramuscular   Manufacturer: [place]   Lot: [id number]   [place]: [phone]

## 2021-08-21 NOTE — Telephone Encounter (Signed)
Lab result note recommendation: "Please let the patient know that her vit d is mildly low. She should increase her current vit intake by 800 IU a day (long term)." (She currently takes 2000 iu's daily)  Patient said that she has Vit D 50,000 units at home and asked if she could take one weekly to be able to use them.  You did not prescribe. She said they may be her husbands she is not sure.

## 2021-08-22 NOTE — Telephone Encounter (Signed)
Jericha informed.

## 2021-08-28 ENCOUNTER — Ambulatory Visit: Payer: 59

## 2021-08-28 ENCOUNTER — Other Ambulatory Visit: Payer: Self-pay

## 2021-08-28 DIAGNOSIS — N920 Excessive and frequent menstruation with regular cycle: Secondary | ICD-10-CM | POA: Diagnosis not present

## 2021-08-29 DIAGNOSIS — H5203 Hypermetropia, bilateral: Secondary | ICD-10-CM | POA: Diagnosis not present

## 2021-08-31 ENCOUNTER — Telehealth: Payer: Self-pay | Admitting: Obstetrics and Gynecology

## 2021-08-31 NOTE — Telephone Encounter (Signed)
Mychart message with ultrasound results was sent.

## 2021-09-14 ENCOUNTER — Other Ambulatory Visit: Payer: Self-pay

## 2021-09-14 ENCOUNTER — Ambulatory Visit: Payer: 59 | Admitting: Family Medicine

## 2021-09-14 ENCOUNTER — Encounter: Payer: Self-pay | Admitting: Family Medicine

## 2021-09-14 VITALS — BP 115/76 | HR 84 | Temp 98.2°F | Ht 61.0 in | Wt 147.0 lb

## 2021-09-14 DIAGNOSIS — R21 Rash and other nonspecific skin eruption: Secondary | ICD-10-CM | POA: Diagnosis not present

## 2021-09-14 DIAGNOSIS — D500 Iron deficiency anemia secondary to blood loss (chronic): Secondary | ICD-10-CM

## 2021-09-14 MED ORDER — PREDNISONE 20 MG PO TABS: 20.0000 mg | ORAL_TABLET | Freq: Every day | ORAL | 0 refills | Status: AC

## 2021-09-14 NOTE — Patient Instructions (Signed)
Meds have been sent the the pharmacy Take cetirizine and pecid If worsening symptoms, let us know or go to the Emergency room

## 2021-09-14 NOTE — Progress Notes (Signed)
Subjective:     Patient ID: Christine Mahoney, female    DOB: Dec 26, 1975, 45 y.o.   MRN: [id number]  Chief Complaint  Patient presents with   Skin irritation    Skin irritation that started 2 days into cruise on 09/04/21 Itching all over   Edema    Swelling in hands and feet that started around the 09/04/21     HPI Cruise on 12/18-on cruise to [place].  Had just started iron as well.  Also, Herbal patch for motion sickness. Itching all over since 12/20.swelling hands and feet. So quit iron and patch and taking ceterizine.  Swelling decreased 12/26(after menses).     Had some bumps and dots all over.   Cruise ended 2 days ago.  Last pm, cetirazine and fair today. Increased sweating underarms and feet.  Fingertips feel "full".  No throat swelling or sob.  Once diarrhea. No f/c.     On iron for anemia.  Changes tampon q 2-3 hrs.   Health Maintenance Due  Topic Date Due   Hepatitis C Screening  Never done   TETANUS/TDAP  Never done   COLONOSCOPY (Pts 45-55yrs Insurance coverage will need to be confirmed)  Never done    Past Medical History:  Diagnosis Date   Fibroid     Past Surgical History:  Procedure Laterality Date   CESAREAN SECTION     2011 and 2013    Outpatient Medications Prior to Visit  Medication Sig Dispense Refill   B Complex Vitamins (VITAMIN B COMPLEX PO) Take by mouth. (Patient not taking: Reported on 09/14/2021)     COVID-19 mRNA bivalent vaccine, Pfizer, (PFIZER COVID-19 VAC BIVALENT) injection Inject into the muscle. (Patient not taking: Reported on 09/14/2021) 0.3 mL 0   VITAMIN D PO Take by mouth. (Patient not taking: Reported on 09/14/2021)     No facility-administered medications prior to visit.    Allergies  Allergen Reactions   Azithromycin Hives   JJK:[id number] except as noted in HPI      Objective:     BP 115/76    Pulse 84    Temp 98.2 F (36.8 C) (Temporal)    Ht 5\' 1"  (1.549 m)    Wt 147 lb (66.7 kg)    LMP  09/08/2021 (Exact Date)    SpO2 99%    BMI 27.78 kg/m  Wt Readings from Last 3 Encounters:  09/14/21 147 lb (66.7 kg)  08/16/21 144 lb (65.3 kg)  12/11/20 147 lb 3.2 oz (66.8 kg)        Gen: WDWN NAD HEENT: NCAT, conjunctiva not injected, sclera nonicteric TM WNL B-wax but can see most of TM.  OP moist, no exudates NECK:  supple, no thyromegaly, no nodes, no carotid bruits CARDIAC: RRR, S1S2+, no murmur. DP 2+B LUNGS: CTAB. No wheezes ABDOMEN:  BS+, soft, NTND, No HSM, no masses EXT:  no edema now MSK: no gross abnormalities.  NEURO: A&O x3.  CN II-XII intact.  PSYCH: normal mood. Good eye contact Skin-fine mp brown rash all over-not palms.  Dry,thickened skin tip of L index finger(pt R handed)  Review pt labs and u/s pelvis  Assessment & Plan:   Problem List Items Addressed This Visit   None Visit Diagnoses     Rash    -  Primary   Iron deficiency anemia due to chronic blood loss          Rash-given timing and diffuse, suspect med or contact-was on cruise.  Improving,  but pt stopped all supps as well.  Discussed may take 2 wks total to resolve.  Cont zyrtec/pepcid.  Pred to hold if worsens.  For hands-HC and vaseline. Iron def anemia prob from fibroids or diet.  Advised to eat more iron rich foods.  Also, she can cautiously re-challenge the iron(vegan/natural liquid) in small dose every few days after 1 wk if still doing ok .  If symptoms return at all, immediately stop and can do pred.  Let us know.  Too many confounding factors to determine etiololgy.     Meds ordered this encounter  Medications   predniSONE (DELTASONE) 20 MG tablet    Sig: Take 1 tablet (20 mg total) by mouth daily with breakfast for 7 days.    Dispense:  7 tablet    Refill:  0    Wellington Hampshire., MD

## 2021-11-16 ENCOUNTER — Other Ambulatory Visit: Payer: 59

## 2022-04-16 ENCOUNTER — Encounter: Payer: Self-pay | Admitting: Family Medicine

## 2022-04-16 ENCOUNTER — Ambulatory Visit: Payer: 59 | Admitting: Family Medicine

## 2022-04-16 VITALS — BP 98/60 | HR 79 | Temp 98.8°F | Ht 61.0 in | Wt 140.8 lb

## 2022-04-16 DIAGNOSIS — R300 Dysuria: Secondary | ICD-10-CM

## 2022-04-16 DIAGNOSIS — R1010 Upper abdominal pain, unspecified: Secondary | ICD-10-CM | POA: Diagnosis not present

## 2022-04-16 DIAGNOSIS — D259 Leiomyoma of uterus, unspecified: Secondary | ICD-10-CM | POA: Diagnosis not present

## 2022-04-16 DIAGNOSIS — R110 Nausea: Secondary | ICD-10-CM

## 2022-04-16 DIAGNOSIS — D500 Iron deficiency anemia secondary to blood loss (chronic): Secondary | ICD-10-CM | POA: Diagnosis not present

## 2022-04-16 LAB — POCT URINALYSIS DIPSTICK
Bilirubin, UA: NEGATIVE
Blood, UA: NEGATIVE
Glucose, UA: NEGATIVE
Ketones, UA: NEGATIVE
Nitrite, UA: NEGATIVE
Protein, UA: NEGATIVE
Spec Grav, UA: 1.015 (ref 1.010–1.025)
Urobilinogen, UA: 0.2 E.U./dL
pH, UA: 6 (ref 5.0–8.0)

## 2022-04-16 LAB — POCT URINE PREGNANCY: Preg Test, Ur: NEGATIVE

## 2022-04-16 NOTE — Patient Instructions (Signed)
Take prilosec daily for 2 weeks.  Worse, let us know.  Ordering ultrasounds.

## 2022-04-16 NOTE — Progress Notes (Signed)
Subjective:     Patient ID: Christine Mahoney, female    DOB: 09-22-1975, 46 y.o.   MRN: [id number]  Chief Complaint  Patient presents with   Abdominal Pain    Pt states she had pain when using the bathroom  but it went away on its on, and only lasted 1/2 a day.    HPI Past few days, feels nausea upper abd.  Constant but worse after eats.  2 wks ago-dysuria for 1/2 day.  But still feels slight some times.  No freq, etc.  No pain LMP 7/2 or so.  Uses condoms.  No f/c.  No energy for long time. Iron def last Dec but takes iron intermitt.  Menses can be heavy. Bm normal.      Health Maintenance Due  Topic Date Due   Hepatitis C Screening  Never done   COLONOSCOPY (Pts 45-68yr Insurance coverage will need to be confirmed)  Never done    Past Medical History:  Diagnosis Date   Fibroid     Past Surgical History:  Procedure Laterality Date   CESAREAN SECTION     2011 and 2013    Outpatient Medications Prior to Visit  Medication Sig Dispense Refill   B Complex Vitamins (VITAMIN B COMPLEX PO) Take by mouth.     COVID-19 mRNA bivalent vaccine, Pfizer, (PFIZER COVID-19 VAC BIVALENT) injection Inject into the muscle. 0.3 mL 0   VITAMIN D PO Take by mouth.     No facility-administered medications prior to visit.    Allergies  Allergen Reactions   Azithromycin Hives   ROS neg/noncontributory except as noted HPI/below      Objective:     BP 98/60   Pulse 79   Temp 98.8 F (37.1 C)   Ht '5\' 1"'$  (1.549 m)   Wt 140 lb 12.8 oz (63.9 kg)   SpO2 98%   BMI 26.60 kg/m  Wt Readings from Last 3 Encounters:  04/16/22 140 lb 12.8 oz (63.9 kg)  09/14/21 147 lb (66.7 kg)  08/16/21 144 lb (65.3 kg)    Physical Exam   Gen: WDWN NAD HEENT: NCAT, conjunctiva not injected, sclera nonicteric NECK:  supple, no thyromegaly, no nodes, no carotid bruits CARDIAC: RRR, S1S2+, no murmur. DP 2+B LUNGS: CTAB. No wheezes ABDOMEN:  BS+, soft, NTND, No HSM, no masses. No cvat.  Can feel  firmness suprapubically-?uterus EXT:  no edema MSK: no gross abnormalities.  NEURO: A&O x3.  CN II-XII intact.  PSYCH: normal mood. Good eye contact  Results for orders placed or performed in visit on 04/16/22  POCT urine pregnancy  Result Value Ref Range   Preg Test, Ur Negative Negative  POCT urinalysis dipstick  Result Value Ref Range   Color, UA YELLOW    Clarity, UA CLEAR    Glucose, UA Negative Negative   Bilirubin, UA NEG    Ketones, UA NEG    Spec Grav, UA 1.015 1.010 - 1.025   Blood, UA NEG    pH, UA 6.0 5.0 - 8.0   Protein, UA Negative Negative   Urobilinogen, UA 0.2 0.2 or 1.0 E.U./dL   Nitrite, UA NEG    Leukocytes, UA Trace (A) Negative   Appearance     Odor          Assessment & Plan:   Problem List Items Addressed This Visit   None Visit Diagnoses     Nausea    -  Primary   Relevant Orders  POCT urine pregnancy (Completed)   Comprehensive metabolic panel   CBC with Differential/Platelet   H. pylori breath test   US ABDOMEN LIMITED RUQ (LIVER/GB)   Dysuria       Relevant Orders   POCT urinalysis dipstick (Completed)   Urine Culture   Iron deficiency anemia due to chronic blood loss       Relevant Orders   Comprehensive metabolic panel   TSH   Vitamin B12   IBC + Ferritin   CBC with Differential/Platelet   Uterine leiomyoma, unspecified location       Relevant Orders   US Pelvic Complete With Transvaginal   Pain of upper abdomen       Relevant Orders   US ABDOMEN LIMITED RUQ (LIVER/GB)      Nausea/pain uppr abd-u preg neg.  ?fiboid pressing up, UTI, gallbladder, ulcer, other.  Will check cbc,cmp,h pylori.  Take prilosec '20mg'$ .  Check u/s galbladder.   Dysuria-check ua/cx. IDA history-feels more fatigued.  Can be from fibroids.  Check cbc,iron studies, B12.  Check u/s uterus  No orders of the defined types were placed in this encounter.   Wellington Hampshire, MD

## 2022-04-17 LAB — COMPREHENSIVE METABOLIC PANEL WITH GFR
ALT: 16 U/L (ref 0–35)
AST: 19 U/L (ref 0–37)
Albumin: 4.1 g/dL (ref 3.5–5.2)
Alkaline Phosphatase: 94 U/L (ref 39–117)
BUN: 11 mg/dL (ref 6–23)
CO2: 28 meq/L (ref 19–32)
Calcium: 9 mg/dL (ref 8.4–10.5)
Chloride: 104 meq/L (ref 96–112)
Creatinine, Ser: 0.69 mg/dL (ref 0.40–1.20)
GFR: 104.47 mL/min
Glucose, Bld: 97 mg/dL (ref 70–99)
Potassium: 3.9 meq/L (ref 3.5–5.1)
Sodium: 137 meq/L (ref 135–145)
Total Bilirubin: 0.2 mg/dL (ref 0.2–1.2)
Total Protein: 7.1 g/dL (ref 6.0–8.3)

## 2022-04-17 LAB — H. PYLORI BREATH TEST

## 2022-04-17 LAB — IBC + FERRITIN
Ferritin: 4.4 ng/mL — ABNORMAL LOW (ref 10.0–291.0)
Iron: 18 ug/dL — ABNORMAL LOW (ref 42–145)
Saturation Ratios: 3.6 % — ABNORMAL LOW (ref 20.0–50.0)
TIBC: 499.8 ug/dL — ABNORMAL HIGH (ref 250.0–450.0)
Transferrin: 357 mg/dL (ref 212.0–360.0)

## 2022-04-17 LAB — CBC WITH DIFFERENTIAL/PLATELET
Basophils Absolute: 0.1 10*3/uL (ref 0.0–0.1)
Basophils Relative: 1.3 % (ref 0.0–3.0)
Eosinophils Absolute: 0.3 10*3/uL (ref 0.0–0.7)
Eosinophils Relative: 3.3 % (ref 0.0–5.0)
HCT: 36 % (ref 36.0–46.0)
Hemoglobin: 11.7 g/dL — ABNORMAL LOW (ref 12.0–15.0)
Lymphocytes Relative: 32.8 % (ref 12.0–46.0)
Lymphs Abs: 2.9 10*3/uL (ref 0.7–4.0)
MCHC: 32.6 g/dL (ref 30.0–36.0)
MCV: 84.3 fl (ref 78.0–100.0)
Monocytes Absolute: 0.5 10*3/uL (ref 0.1–1.0)
Monocytes Relative: 5.8 % (ref 3.0–12.0)
Neutro Abs: 5.1 10*3/uL (ref 1.4–7.7)
Neutrophils Relative %: 56.8 % (ref 43.0–77.0)
Platelets: 347 10*3/uL (ref 150.0–400.0)
RBC: 4.27 Mil/uL (ref 3.87–5.11)
RDW: 13.8 % (ref 11.5–15.5)
WBC: 8.9 10*3/uL (ref 4.0–10.5)

## 2022-04-17 LAB — TSH: TSH: 1.86 u[IU]/mL (ref 0.35–5.50)

## 2022-04-17 LAB — URINE CULTURE
MICRO NUMBER:: 13720720
SPECIMEN QUALITY:: ADEQUATE

## 2022-04-17 LAB — VITAMIN B12: Vitamin B-12: 524 pg/mL (ref 211–911)

## 2022-04-18 ENCOUNTER — Other Ambulatory Visit: Payer: Self-pay | Admitting: *Deleted

## 2022-04-18 ENCOUNTER — Encounter: Payer: Self-pay | Admitting: Family Medicine

## 2022-04-18 DIAGNOSIS — R110 Nausea: Secondary | ICD-10-CM

## 2022-04-19 ENCOUNTER — Encounter: Payer: Self-pay | Admitting: Family Medicine

## 2022-04-23 ENCOUNTER — Other Ambulatory Visit (INDEPENDENT_AMBULATORY_CARE_PROVIDER_SITE_OTHER): Payer: 59

## 2022-04-23 ENCOUNTER — Other Ambulatory Visit: Payer: Self-pay

## 2022-04-23 DIAGNOSIS — R110 Nausea: Secondary | ICD-10-CM

## 2022-04-23 LAB — H. PYLORI ANTIBODY, IGG: H Pylori IgG: NEGATIVE

## 2022-04-23 NOTE — Progress Notes (Signed)
Hp

## 2022-04-23 NOTE — Addendum Note (Signed)
Addended by: Zacarias Pontes on: 04/23/2022 02:34 PM   Modules accepted: Orders

## 2022-04-24 ENCOUNTER — Ambulatory Visit
Admission: RE | Admit: 2022-04-24 | Discharge: 2022-04-24 | Disposition: A | Discharge status: Home / Self Care | Payer: 59 | Source: Ambulatory Visit | Attending: Family Medicine | Admitting: Family Medicine

## 2022-04-24 DIAGNOSIS — D251 Intramural leiomyoma of uterus: Secondary | ICD-10-CM | POA: Diagnosis not present

## 2022-04-24 DIAGNOSIS — D259 Leiomyoma of uterus, unspecified: Secondary | ICD-10-CM

## 2022-04-26 ENCOUNTER — Ambulatory Visit
Admission: RE | Admit: 2022-04-26 | Discharge: 2022-04-26 | Disposition: A | Discharge status: Home / Self Care | Payer: 59 | Source: Ambulatory Visit | Attending: Family Medicine | Admitting: Family Medicine

## 2022-04-26 DIAGNOSIS — R1013 Epigastric pain: Secondary | ICD-10-CM | POA: Diagnosis not present

## 2022-04-26 DIAGNOSIS — R110 Nausea: Secondary | ICD-10-CM

## 2022-04-26 DIAGNOSIS — R1010 Upper abdominal pain, unspecified: Secondary | ICD-10-CM

## 2022-04-29 ENCOUNTER — Encounter: Payer: Self-pay | Admitting: Family Medicine

## 2022-04-29 ENCOUNTER — Other Ambulatory Visit: Payer: Self-pay | Admitting: *Deleted

## 2022-04-29 DIAGNOSIS — R160 Hepatomegaly, not elsewhere classified: Secondary | ICD-10-CM

## 2022-04-30 NOTE — Progress Notes (Signed)
Ct

## 2022-05-03 ENCOUNTER — Telehealth: Payer: Self-pay | Admitting: Family Medicine

## 2022-05-03 NOTE — Telephone Encounter (Signed)
Patient requests to be called at ph# [phone] to be advised on details of CT Scan order and wants to make sure that the Scan will include abdomen and pelvis.  Patient states she is okay with going to an outside facility even if there is an upcharge for scan because she wants a sooner appointment than she was able to get (05/14/22-because school will have started).

## 2022-05-06 ENCOUNTER — Telehealth: Payer: Self-pay | Admitting: Family Medicine

## 2022-05-06 NOTE — Telephone Encounter (Signed)
This is being managed by dr. Cherlynn Kaiser. See multiple other communications from pt.

## 2022-05-06 NOTE — Telephone Encounter (Signed)
Patient would like CT scan order to be sent to [facility] so that she can have test done sooner-

## 2022-05-07 NOTE — Telephone Encounter (Signed)
Patient notified of below message.

## 2022-05-10 ENCOUNTER — Telehealth: Payer: Self-pay | Admitting: Family Medicine

## 2022-05-10 NOTE — Telephone Encounter (Signed)
Patient is requesting that authorization for CT Liver abdomen w/wo contrast be sent to [place] at [facility]. She would like to have this imaging done there.

## 2022-05-14 ENCOUNTER — Ambulatory Visit
Admission: RE | Admit: 2022-05-14 | Discharge: 2022-05-14 | Disposition: A | Discharge status: Home / Self Care | Payer: 59 | Source: Ambulatory Visit | Attending: Family Medicine | Admitting: Family Medicine

## 2022-05-14 DIAGNOSIS — R160 Hepatomegaly, not elsewhere classified: Secondary | ICD-10-CM

## 2022-05-14 DIAGNOSIS — K7689 Other specified diseases of liver: Secondary | ICD-10-CM | POA: Diagnosis not present

## 2022-05-14 DIAGNOSIS — N200 Calculus of kidney: Secondary | ICD-10-CM | POA: Diagnosis not present

## 2022-05-14 DIAGNOSIS — Q8909 Congenital malformations of spleen: Secondary | ICD-10-CM | POA: Diagnosis not present

## 2022-05-14 DIAGNOSIS — R590 Localized enlarged lymph nodes: Secondary | ICD-10-CM | POA: Diagnosis not present

## 2022-05-14 MED ORDER — IOPAMIDOL (ISOVUE-300) INJECTION 61%
100.0000 mL | Freq: Once | INTRAVENOUS | Status: AC | PRN
Start: 2022-05-14 — End: 2022-05-14
  Administered 2022-05-14: 100 mL via INTRAVENOUS

## 2022-06-03 ENCOUNTER — Telehealth: Payer: Self-pay | Admitting: Physician Assistant

## 2022-06-03 NOTE — Telephone Encounter (Signed)
Scheduled appt per 9/15 staff msg. Called pt, no answer and vm was full.

## 2022-06-05 ENCOUNTER — Telehealth: Payer: Self-pay | Admitting: Physician Assistant

## 2022-06-05 NOTE — Telephone Encounter (Signed)
Spoke to pt, confirmed her appts on 9/26.

## 2022-06-10 ENCOUNTER — Other Ambulatory Visit: Payer: Self-pay

## 2022-06-10 NOTE — Progress Notes (Signed)
[place] Telephone:(336) [phone]   Fax:(336) [place] NOTE  Patient Care Team: Leamon Arnt, MD as PCP - General (Family Medicine) Salvadore Dom, MD as Consulting Physician (Obstetrics and Gynecology)  Hematological/Oncological History -08/16/2021: WBC 6.6, Hgb 11.6, MCV 82.0, Plt 383, Ferritin 6. -04/16/2022: WBC 8.9, Hgb 11.7, MCV 84.3, Plt 347, Iron 18, Saturation 3.6%, Ferritin 4.4, TIBC 499.8  -06/11/2022: Establish care with [facility] Hematology with Dr. Narda Rutherford and Dede Query PA-C  CHIEF COMPLAINTS/PURPOSE OF CONSULTATION:  Iron deficiency anemia  HISTORY OF PRESENTING ILLNESS:  Christine Mahoney 46 y.o. female with medical history significant for fibroids presents to the hematology clinic for evaluation for iron deficiency anemia.  She is unaccompanied for this visit.  On exam today, Christine Mahoney does report fatigue but is able to complete all her daily activities on her own.  She denies any appetite changes but does follow a vegetarian diet.  She is trying to eat iron rich foods such as spinach.  She is compliant with taking p.o. iron for the last 3 to 4 months.  She reports intolerance to iron pills with diarrhea.  She denies nausea, vomiting or abdominal pain.  She has noticed heavier menstrual bleeding.  Each cycle is days with 3 days of heavy bleeding.  She changes her tampon every 2 hours regardless if it is saturated or not.  She has felt lightheadedness recently but denies any syncopal episodes.  She denies fevers, chills, night sweats, shortness of breath, chest pain or cough.  She has no other complaints.  Rest of the 10 point ROS is below.   MEDICAL HISTORY:  Past Medical History:  Diagnosis Date   Fibroid     SURGICAL HISTORY: Past Surgical History:  Procedure Laterality Date   CESAREAN SECTION     2011 and 2013    SOCIAL HISTORY: Social History   Socioeconomic History   Marital status: Married    Spouse name: Not on  file   Number of children: Not on file   Years of education: Not on file   Highest education level: Not on file  Occupational History   Not on file  Tobacco Use   Smoking status: Never   Smokeless tobacco: Never  Substance and Sexual Activity   Alcohol use: No   Drug use: No   Sexual activity: Yes    Partners: Male    Birth control/protection: Condom  Other Topics Concern   Not on file  Social History Narrative   Not on file   Social Determinants of Health   Financial Resource Strain: Not on file  Food Insecurity: Not on file  Transportation Needs: Not on file  Physical Activity: Not on file  Stress: Not on file  Social Connections: Not on file  Intimate Partner Violence: Not on file    FAMILY HISTORY: Family History  Problem Relation Age of Onset   Hypotension Mother    Hypertension Father    Healthy Brother    Healthy Son    Cancer Neg Hx    Diabetes Neg Hx    Heart disease Neg Hx    Stroke Neg Hx     ALLERGIES:  is allergic to azithromycin.  MEDICATIONS:  Current Outpatient Medications  Medication Sig Dispense Refill   B Complex Vitamins (VITAMIN B COMPLEX PO) Take by mouth.     COVID-19 mRNA bivalent vaccine, Pfizer, (PFIZER COVID-19 VAC BIVALENT) injection Inject into the muscle. 0.3 mL 0   VITAMIN  D PO Take by mouth.     No current facility-administered medications for this visit.    REVIEW OF SYSTEMS:   Constitutional: ( - ) fevers, ( - )  chills , ( - ) night sweats Eyes: ( - ) blurriness of vision, ( - ) double vision, ( - ) watery eyes Ears, nose, mouth, throat, and face: ( - ) mucositis, ( - ) sore throat Respiratory: ( - ) cough, ( - ) dyspnea, ( - ) wheezes Cardiovascular: ( - ) palpitation, ( - ) chest discomfort, ( - ) lower extremity swelling Gastrointestinal:  ( - ) nausea, ( - ) heartburn, ( - ) change in bowel habits Skin: ( - ) abnormal skin rashes Lymphatics: ( - ) new lymphadenopathy, ( - ) easy bruising Neurological: ( - )  numbness, ( - ) tingling, ( - ) new weaknesses Behavioral/Psych: ( - ) mood change, ( - ) new changes  All other systems were reviewed with the patient and are negative.  PHYSICAL EXAMINATION: ECOG PERFORMANCE STATUS: 1 - Symptomatic but completely ambulatory  There were no vitals filed for this visit. There were no vitals filed for this visit.  GENERAL: well appearing female in NAD  SKIN: skin color, texture, turgor are normal, no rashes or significant lesions EYES: conjunctiva are pink and non-injected, sclera clear OROPHARYNX: no exudate, no erythema; lips, buccal mucosa, and tongue normal  NECK: supple, non-tender LYMPH:  no palpable lymphadenopathy in the cervical or supraclavicular lymph nodes.  LUNGS: clear to auscultation and percussion with normal breathing effort HEART: regular rate & rhythm and no murmurs and no lower extremity edema ABDOMEN: soft, non-tender, non-distended, normal bowel sounds Musculoskeletal: no cyanosis of digits and no clubbing  PSYCH: alert & oriented x 3, fluent speech NEURO: no focal motor/sensory deficits  LABORATORY DATA:  I have reviewed the data as listed    Latest Ref Rng & Units 04/16/2022    4:13 PM 08/16/2021   11:46 AM 06/13/2020    4:21 PM  CBC  WBC 4.0 - 10.5 K/uL 8.9  6.6  6.9   Hemoglobin 12.0 - 15.0 g/dL 11.7  11.6  13.0   Hematocrit 36.0 - 46.0 % 36.0  35.9  39.9   Platelets 150.0 - 400.0 K/uL 347.0  383  285        Latest Ref Rng & Units 04/16/2022    4:13 PM 08/16/2021   11:46 AM 06/13/2020    4:21 PM  CMP  Glucose 70 - 99 mg/dL 97  80  161   BUN 6 - 23 mg/dL '11  11  10   '$ Creatinine 0.40 - 1.20 mg/dL 0.69  0.56  0.59   Sodium 135 - 145 mEq/L 137  140  139   Potassium 3.5 - 5.1 mEq/L 3.9  4.7  3.8   Chloride 96 - 112 mEq/L 104  104  106   CO2 19 - 32 mEq/L '28  30  23   '$ Calcium 8.4 - 10.5 mg/dL 9.0  9.5  9.3   Total Protein 6.0 - 8.3 g/dL 7.1  7.0  6.6   Total Bilirubin 0.2 - 1.2 mg/dL 0.2  0.3  0.3   Alkaline Phos 39 -  117 U/L 94     AST 0 - 37 U/L '19  19  24   '$ ALT 0 - 35 U/L '16  15  19     '$ RADIOGRAPHIC STUDIES: I have personally reviewed the radiological images as listed and  agreed with the findings in the report. CT LIVER ABDOMEN W WO CONTRAST  Result Date: 05/14/2022 CLINICAL DATA:  Possible hepatic lesion seen on prior ultrasound, further evaluation. EXAM: CT ABDOMEN WITHOUT AND WITH CONTRAST TECHNIQUE: Multidetector CT imaging of the abdomen was performed following the standard protocol before and following the bolus administration of intravenous contrast. RADIATION DOSE REDUCTION: This exam was performed according to the departmental dose-optimization program which includes automated exposure control, adjustment of the mA and/or kV according to patient size and/or use of iterative reconstruction technique. CONTRAST:  177m ISOVUE-300 IOPAMIDOL (ISOVUE-300) INJECTION 61% COMPARISON:  Ultrasound August 11 ,2023 FINDINGS: Lower chest: No acute abnormality. Hepatobiliary: Arterially enhancing 12 mm focus in the right lobe of the liver on image 33/6 is not well seen on precontrast imaging and equilibrates to background liver on portal venous phase and more delayed postcontrast imaging, most likely reflecting an intrahepatic shunt versus an FNH or adenoma. No hepatic changes to suggest cirrhosis. Hypodense 4 mm lesion in the inferior right lobe of the liver on image 54/11 is technically too small to accurately characterize but does not appear to demonstrate postcontrast enhancement and most likely reflects a cyst. Gallbladder is unremarkable.  No biliary ductal dilation. Pancreas: No pancreatic ductal dilation or evidence of acute inflammation. Spleen: No splenomegaly or focal splenic lesion.  Tiny splenules. Adrenals/Urinary Tract: Bilateral adrenal glands appear normal. No hydronephrosis. Kidneys demonstrate symmetric enhancement and excretion of contrast material. Nonobstructive right renal stones measure up to 3 mm.  Stomach/Bowel: Radiopaque enteric contrast material traverses the splenic flexure. Stomach is unremarkable for degree of distension. No pathologic dilation of small or large bowel. Moderate volume of formed stool throughout the visualized colon suggestive of constipation. Vascular/Lymphatic: Normal caliber abdominal aorta. The portal, splenic and superior mesenteric veins are patent. No pathologically enlarged abdominal or pelvic lymph nodes. Prominent lymph nodes in the jejunal mesentery measure up to 4 mm in short axis and are favored reactive. Other: No significant abdominal free fluid. Musculoskeletal: No acute osseous abnormality. IMPRESSION: 1. Arterially enhancing 12 mm focus in the right lobe of the liver demonstrates imaging characteristics which are nonspecific but almost certainly benign, likely reflecting an FNH or adenoma versus intrahepatic shunt consider more definitive characterization and assessment of stability by follow-up hepatic protocol MRI in 6 months with and without intravenous contrast. 2. Nonobstructive right renal stones. 3. Prominent lymph nodes in the jejunal mesentery are not pathologically enlarged by size criteria and nonspecific but favored reactive. Electronically Signed   By: JDahlia BailiffM.D.   On: 05/14/2022 18:28    ASSESSMENT & PLAN Christine MABBYE LAOis a 46y.o. female who presents to the hematology clinic for initial evaluation for iron deficiency anemia secondary to menstrual bleeding.  She is currently taking ferrous sulfate once daily with minimal side effects including diarrhea.  She is under the care of gynecologist who she sees annually.  We recommend a follow-up with GYN to discuss interventions to help with menstrual bleeding.  Patient proceed with repeat labs today to check CBC, CMP, reticulocyte panel, ferritin, iron and TIBC levels.  If there is persistent iron deficiency anemia, we will recommend IV iron to help bolster her levels.  #Iron deficiency anemia  2/2 heavy menstrual bleeding: --Currently on ferrous sulfate 325 mg once daily with a source of vitamin C. --Labs today to check CBC, CMP, retic panel, ferritin, iron and TIBC levels --If today's labs show persistent iron deficiency, recommend IV iron infusions --Provided list of iron rich  foods to incorporate into diet. Patient is vegetarian.  --Under the care of OB/GYN, Dr. Sumner Boast. Recommend to follow up. --RTC in 8 weeks with labs   No orders of the defined types were placed in this encounter.   All questions were answered. The patient knows to call the clinic with any problems, questions or concerns.  I have spent a total of 45 minutes minutes of face-to-face and non-face-to-face time, preparing to see the patient, obtaining and/or reviewing separately obtained history, performing a medically appropriate examination, counseling and educating the patient, ordering medications/tests, documenting clinical information in the electronic health record, and care coordination.   Dede Query, PA-C Department of Hematology/Oncology [place] at [facility] Phone: [phone]  Patient was seen with Dr. Lorenso Courier.   I have read the above note and personally examined the patient. I agree with the assessment and plan as noted above.  Briefly Christine Mahoney is a 46 year old [place] female who presents for evaluation of iron deficiency anemia.  Her iron deficiency anemia is thought to be secondary to her heavy menstrual cycles.  She reports that she is also vegetarian.  She is on iron pills but these have been ineffective in increasing her iron levels.  As such I would recommend we proceed with IV iron therapy.  We discussed the risks and benefits of the treatment and she agreed to proceed with IV iron treatment.  We will plan to see the patient back approximately 4 to 6 weeks after last dose of IV iron in order to assure her levels have increased appropriately.  The patient  voiced understanding of the plan moving forward.   Ledell Peoples, MD Department of Hematology/Oncology [place] at [facility] Phone: [phone] Pager: [phone] Email: [email or web address]

## 2022-06-11 ENCOUNTER — Inpatient Hospital Stay: Payer: 59

## 2022-06-11 ENCOUNTER — Telehealth: Payer: Self-pay

## 2022-06-11 ENCOUNTER — Encounter: Payer: Self-pay | Admitting: Physician Assistant

## 2022-06-11 ENCOUNTER — Inpatient Hospital Stay: Payer: 59 | Attending: Physician Assistant | Admitting: Physician Assistant

## 2022-06-11 VITALS — BP 115/79 | HR 82 | Temp 97.5°F | Resp 14 | Ht 63.0 in | Wt 146.5 lb

## 2022-06-11 DIAGNOSIS — N920 Excessive and frequent menstruation with regular cycle: Secondary | ICD-10-CM | POA: Insufficient documentation

## 2022-06-11 DIAGNOSIS — D500 Iron deficiency anemia secondary to blood loss (chronic): Secondary | ICD-10-CM | POA: Insufficient documentation

## 2022-06-11 DIAGNOSIS — D509 Iron deficiency anemia, unspecified: Secondary | ICD-10-CM | POA: Diagnosis not present

## 2022-06-11 LAB — RETIC PANEL
Immature Retic Fract: 27.5 % — ABNORMAL HIGH (ref 2.3–15.9)
RBC.: 4.05 MIL/uL (ref 3.87–5.11)
Retic Count, Absolute: 60.8 10*3/uL (ref 19.0–186.0)
Retic Ct Pct: 1.5 % (ref 0.4–3.1)
Reticulocyte Hemoglobin: 27.9 pg — ABNORMAL LOW (ref >27.9)

## 2022-06-11 LAB — CBC WITH DIFFERENTIAL (CANCER CENTER ONLY)
Abs Immature Granulocytes: 0.01 10*3/uL (ref 0.00–0.07)
Basophils Absolute: 0.1 10*3/uL (ref 0.0–0.1)
Basophils Relative: 2 %
Eosinophils Absolute: 0.2 10*3/uL (ref 0.0–0.5)
Eosinophils Relative: 3 %
HCT: 33.7 % — ABNORMAL LOW (ref 36.0–46.0)
Hemoglobin: 10.8 g/dL — ABNORMAL LOW (ref 12.0–15.0)
Immature Granulocytes: 0 %
Lymphocytes Relative: 39 %
Lymphs Abs: 2.5 10*3/uL (ref 0.7–4.0)
MCH: 26.2 pg (ref 26.0–34.0)
MCHC: 32 g/dL (ref 30.0–36.0)
MCV: 81.6 fL (ref 80.0–100.0)
Monocytes Absolute: 0.4 10*3/uL (ref 0.1–1.0)
Monocytes Relative: 6 %
Neutro Abs: 3.3 10*3/uL (ref 1.7–7.7)
Neutrophils Relative %: 50 %
Platelet Count: 328 10*3/uL (ref 150–400)
RBC: 4.13 MIL/uL (ref 3.87–5.11)
RDW: 14 % (ref 11.5–15.5)
WBC Count: 6.5 10*3/uL (ref 4.0–10.5)
nRBC: 0 % (ref 0.0–0.2)

## 2022-06-11 LAB — IRON AND IRON BINDING CAPACITY (CC-WL,HP ONLY)
Iron: 18 ug/dL — ABNORMAL LOW (ref 28–170)
Saturation Ratios: 4 % — ABNORMAL LOW (ref 10.4–31.8)
TIBC: 504 ug/dL — ABNORMAL HIGH (ref 250–450)
UIBC: 486 ug/dL — ABNORMAL HIGH (ref 148–442)

## 2022-06-11 LAB — CMP (CANCER CENTER ONLY)
ALT: 10 U/L (ref 0–44)
AST: 15 U/L (ref 15–41)
Albumin: 4.2 g/dL (ref 3.5–5.0)
Alkaline Phosphatase: 59 U/L (ref 38–126)
Anion gap: 5 (ref 5–15)
BUN: 10 mg/dL (ref 6–20)
CO2: 27 mmol/L (ref 22–32)
Calcium: 8.9 mg/dL (ref 8.9–10.3)
Chloride: 107 mmol/L (ref 98–111)
Creatinine: 0.63 mg/dL (ref 0.44–1.00)
GFR, Estimated: >60 mL/min (ref >60)
Glucose, Bld: 100 mg/dL — ABNORMAL HIGH (ref 70–99)
Potassium: 4.2 mmol/L (ref 3.5–5.1)
Sodium: 139 mmol/L (ref 135–145)
Total Bilirubin: 0.3 mg/dL (ref 0.3–1.2)
Total Protein: 7.2 g/dL (ref 6.5–8.1)

## 2022-06-11 LAB — FERRITIN: Ferritin: 3 ng/mL — ABNORMAL LOW (ref 11–307)

## 2022-06-11 NOTE — Telephone Encounter (Signed)
Pt advised of lab results and the need for IV iron.  She has asked if she can get the one time dose and pay out of pocket.   Please advise

## 2022-06-11 NOTE — Telephone Encounter (Signed)
-----   Message from Lincoln Brigham, PA-C sent at 06/11/2022  1:40 PM EDT ----- Simon Rhein, Please notify patient that she will need IV iron to help improve her iron deficiency. Her insurance requires IV venofer which will be once a week x 5 doses.

## 2022-06-12 NOTE — Telephone Encounter (Signed)
Pt has decided to go with the 5 doses of IV venofer

## 2022-06-13 ENCOUNTER — Other Ambulatory Visit: Payer: Self-pay | Admitting: Physician Assistant

## 2022-06-16 ENCOUNTER — Encounter: Payer: Self-pay | Admitting: Physician Assistant

## 2022-06-18 ENCOUNTER — Telehealth: Payer: Self-pay | Admitting: Physician Assistant

## 2022-06-18 NOTE — Telephone Encounter (Signed)
Scheduled per 09/26 los, patient has been called and notified.  

## 2022-06-21 ENCOUNTER — Other Ambulatory Visit: Payer: Self-pay

## 2022-06-21 ENCOUNTER — Inpatient Hospital Stay: Payer: 59 | Attending: Physician Assistant

## 2022-06-21 VITALS — BP 114/74 | HR 72 | Temp 98.0°F | Resp 16

## 2022-06-21 DIAGNOSIS — D500 Iron deficiency anemia secondary to blood loss (chronic): Secondary | ICD-10-CM | POA: Diagnosis not present

## 2022-06-21 DIAGNOSIS — N920 Excessive and frequent menstruation with regular cycle: Secondary | ICD-10-CM | POA: Insufficient documentation

## 2022-06-21 MED ORDER — SODIUM CHLORIDE 0.9 % IV SOLN
200.0000 mg | Freq: Once | INTRAVENOUS | Status: AC
  Administered 2022-06-21: 200 mg via INTRAVENOUS
  Filled 2022-06-21: qty 200

## 2022-06-21 MED ORDER — SODIUM CHLORIDE 0.9 % IV SOLN: Freq: Once | INTRAVENOUS | Status: AC

## 2022-06-21 NOTE — Progress Notes (Signed)
Per Dr Lorenso Courier, no premeds needed at this time

## 2022-06-21 NOTE — Patient Instructions (Signed)

## 2022-06-28 ENCOUNTER — Inpatient Hospital Stay: Payer: 59

## 2022-06-28 ENCOUNTER — Other Ambulatory Visit: Payer: Self-pay | Admitting: Physician Assistant

## 2022-06-28 VITALS — BP 117/77 | HR 76 | Temp 98.1°F | Resp 17

## 2022-06-28 DIAGNOSIS — N920 Excessive and frequent menstruation with regular cycle: Secondary | ICD-10-CM | POA: Diagnosis not present

## 2022-06-28 DIAGNOSIS — D500 Iron deficiency anemia secondary to blood loss (chronic): Secondary | ICD-10-CM

## 2022-06-28 MED ORDER — SODIUM CHLORIDE 0.9 % IV SOLN: Freq: Once | INTRAVENOUS | Status: AC

## 2022-06-28 MED ORDER — SODIUM CHLORIDE 0.9 % IV SOLN
200.0000 mg | Freq: Once | INTRAVENOUS | Status: AC
  Administered 2022-06-28: 200 mg via INTRAVENOUS
  Filled 2022-06-28: qty 200

## 2022-06-28 NOTE — Progress Notes (Signed)
Pt declined to stay for post 30 min observation, discharged with VSS, ambulatory to lobby.

## 2022-06-28 NOTE — Progress Notes (Signed)
Pt states she does not take premedications with venofer infusion.

## 2022-06-28 NOTE — Patient Instructions (Signed)

## 2022-07-05 ENCOUNTER — Ambulatory Visit: Payer: 59

## 2022-07-12 ENCOUNTER — Inpatient Hospital Stay: Payer: 59

## 2022-07-12 ENCOUNTER — Other Ambulatory Visit: Payer: Self-pay

## 2022-07-12 VITALS — BP 112/75 | HR 83 | Temp 98.4°F | Resp 18

## 2022-07-12 DIAGNOSIS — D500 Iron deficiency anemia secondary to blood loss (chronic): Secondary | ICD-10-CM | POA: Diagnosis not present

## 2022-07-12 DIAGNOSIS — N920 Excessive and frequent menstruation with regular cycle: Secondary | ICD-10-CM | POA: Diagnosis not present

## 2022-07-12 MED ORDER — SODIUM CHLORIDE 0.9 % IV SOLN
200.0000 mg | Freq: Once | INTRAVENOUS | Status: AC
  Administered 2022-07-12: 200 mg via INTRAVENOUS
  Filled 2022-07-12: qty 200

## 2022-07-12 MED ORDER — SODIUM CHLORIDE 0.9 % IV SOLN: Freq: Once | INTRAVENOUS | Status: AC

## 2022-07-12 NOTE — Patient Instructions (Signed)

## 2022-07-19 ENCOUNTER — Inpatient Hospital Stay: Payer: 59 | Attending: Physician Assistant

## 2022-07-19 ENCOUNTER — Other Ambulatory Visit: Payer: Self-pay

## 2022-07-19 VITALS — BP 114/73 | HR 74 | Temp 97.6°F | Resp 17

## 2022-07-19 DIAGNOSIS — D500 Iron deficiency anemia secondary to blood loss (chronic): Secondary | ICD-10-CM | POA: Insufficient documentation

## 2022-07-19 DIAGNOSIS — N920 Excessive and frequent menstruation with regular cycle: Secondary | ICD-10-CM | POA: Insufficient documentation

## 2022-07-19 MED ORDER — SODIUM CHLORIDE 0.9 % IV SOLN: Freq: Once | INTRAVENOUS | Status: AC

## 2022-07-19 MED ORDER — SODIUM CHLORIDE 0.9 % IV SOLN
200.0000 mg | Freq: Once | INTRAVENOUS | Status: AC
  Administered 2022-07-19: 200 mg via INTRAVENOUS
  Filled 2022-07-19: qty 200

## 2022-07-19 NOTE — Patient Instructions (Signed)

## 2022-07-26 ENCOUNTER — Other Ambulatory Visit: Payer: Self-pay

## 2022-07-26 ENCOUNTER — Inpatient Hospital Stay: Payer: 59

## 2022-07-26 VITALS — BP 104/68 | HR 74 | Resp 16

## 2022-07-26 DIAGNOSIS — D500 Iron deficiency anemia secondary to blood loss (chronic): Secondary | ICD-10-CM

## 2022-07-26 DIAGNOSIS — N920 Excessive and frequent menstruation with regular cycle: Secondary | ICD-10-CM | POA: Diagnosis not present

## 2022-07-26 MED ORDER — SODIUM CHLORIDE 0.9 % IV SOLN
200.0000 mg | Freq: Once | INTRAVENOUS | Status: AC
  Administered 2022-07-26: 200 mg via INTRAVENOUS
  Filled 2022-07-26: qty 200

## 2022-07-26 MED ORDER — SODIUM CHLORIDE 0.9 % IV SOLN: Freq: Once | INTRAVENOUS | Status: AC

## 2022-07-26 NOTE — Patient Instructions (Signed)

## 2022-08-13 NOTE — Progress Notes (Signed)
46 y.o. [id number] Married Asian Not Hispanic or Latino female here for annual exam.   H/O fibroid uterus. No dyspareunia.  Period Cycle (Days): 28 Period Duration (Days): 5 Period Pattern: Regular Menstrual Control: Tampon, Maxi pad (super +) Menstrual Control Change Freq (Hours): 3-4 Dysmenorrhea: None  H/O mild iron def anemia. She had IV infusion of iron last month.  She is a vegetarian. She doesn't tolerate oral iron well.   Last ultrasound  04/24/22 FINDINGS: Uterus Measurements: 13.3 x 7.5 x 6.2 cm = volume: 325.0 mL. Uterus is anteverted with several fibroids seen as follows; 1. 3.4 x 4.3 x 3.8 cm intramural fibroid at the left anterior mid uterine body. 2. 5.5 x 4.8 x 5.0 cm intramural fibroid at the anterior uterine fundus. 3. 1.5 x 1.7 x 1.5 cm intramural fibroid at the left uterine fundus. Endometrium Thickness: 10.6 mm.  No focal abnormality visualized. Right ovary  Measurements: 4.0 x 1.9 x 2.7 cm = volume: 10.8 mL. Normal appearance/no adnexal mass. Left ovary  Not visualized.  No adnexal mass. Other findings  No abnormal free fluid. IMPRESSION: 1. Enlarged fibroid uterus as detailed above. 2. Normal endometrium. 3. Normal right ovary, with nonvisualization of the left ovary. No adnexal mass or free fluid.  04/16/22 TSH 1.86 Vit B 12 524 Ferritin 4.4 Hgb 11.7  06/11/22  Ferritin 3 Hgb 10.8 CMP normal (other than glucose of 100)  She had iron transfusions on 07/12/22, 07/19/22, and 07/26/22  Patient's last menstrual period was 08/16/2022.          Sexually active: Yes.    The current method of family planning is condoms.    Exercising: Yes.     Strength training and walking  Smoker:  no  Health Maintenance: Pap: 08/16/21 WNL Hr HPV Neg; 11/20/16 negative HR HPV negative  History of abnormal Pap:  no MMG: none   BMD:   n/a Colonoscopy: declines TDaP:  unsure  Gardasil: none    reports that she has never smoked. She has never used smokeless tobacco.  She reports that she does not drink alcohol and does not use drugs. Husband is an Materials engineer with [employer]. She runs a business. They have 2 young sons, 74 and 70.     Past Medical History:  Diagnosis Date   Fibroid     Past Surgical History:  Procedure Laterality Date   CESAREAN SECTION     2011 and 2013    Current Outpatient Medications  Medication Sig Dispense Refill   B Complex Vitamins (VITAMIN B COMPLEX PO) Take by mouth.     FERROUS SULFATE PO Take by mouth.     VITAMIN D PO Take by mouth.     No current facility-administered medications for this visit.    Family History  Problem Relation Age of Onset   Hypotension Mother    Hypertension Father    Healthy Brother    Healthy Son    Cancer Neg Hx    Diabetes Neg Hx    Heart disease Neg Hx    Stroke Neg Hx     Review of Systems  All other systems reviewed and are negative.   Exam:   BP 110/72   Pulse 78   Ht '5\' 1"'$  (1.549 m)   Wt 144 lb (65.3 kg)   LMP 08/16/2022   SpO2 99%   BMI 27.21 kg/m   Weight change: '@WEIGHTCHANGE'$ @ Height:   Height: '5\' 1"'$  (154.9 cm)  Ht Readings from Last 3 Encounters:  08/22/22 '5\' 1"'$  (1.549 m)  06/11/22 '5\' 3"'$  (1.6 m)  04/16/22 '5\' 1"'$  (1.549 m)    General appearance: alert, cooperative and appears stated age Head: Normocephalic, without obvious abnormality, atraumatic Neck: no adenopathy, supple, symmetrical, trachea midline and thyroid normal to inspection and palpation Lungs: clear to auscultation bilaterally Cardiovascular: regular rate and rhythm Breasts: normal appearance, no masses or tenderness Abdomen: soft, non-tender; non distended,  no masses,  no organomegaly Extremities: extremities normal, atraumatic, no cyanosis or edema Skin: Skin color, texture, turgor normal. No rashes or lesions Lymph nodes: Cervical, supraclavicular, and axillary nodes normal. No abnormal inguinal nodes palpated Neurologic: Grossly normal   Pelvic: External genitalia:  no lesions               Urethra:  normal appearing urethra with no masses, tenderness or lesions              Bartholins and Skenes: normal                 Vagina: normal appearing vagina with normal color and discharge, no lesions              Cervix: no lesions               Bimanual Exam:  Uterus:   slightly enlarged and irregular (c/w known fibroids), anteverted, not tender              Adnexa: no mass, fullness, tenderness               Rectovaginal: Confirms               Anus:  normal sphincter tone, no lesions  Gae Dry, CMA chaperoned for the exam.  1. Well woman exam Discussed breast self exam Discussed calcium and vit D intake No pap this year She declines colon cancer screening, we discussed the reasoning She has declined mammogram as well. We discussed the prevalence of breast cancer, and reviewed that mammograms can detect cancer before they are palpable and when they are more easily treated. She will consider having a mammogram.  2. Uterine leiomyoma, unspecified location I reviewed her ultrasound images with her and compared them to her prior u/s. One of the fibroids appears adjacent to the cavity, but doesn't appear subserosa.   3. History of iron deficiency anemia Suspect it is a combination of dietary insufficiency and heavy cycles. She has had iron transfusion and will f/u with Hematology later this month. She will further discuss daily, oral iron with them. We discussed options to decrease her blood flow with her cycles. She was recently diagnosed with a small focus in her lesion, MRI in 2/24 was recommended. I wouldn't recommend hormonal contraception (not interested anyway) or a mirena IUD until her f/u MRI. We discussed trying NSAID's with her cycle to lighten them or the option of Lysteda. For now she would like to try NSAID's - naproxen sodium (ANAPROX DS) 550 MG tablet; Take 1 tablet (550 mg total) by mouth 2 (two) times daily with a meal. Take as needed for heavy cycles  Dispense:  30 tablet; Refill: 2  4. Menorrhagia with regular cycle Her cycles don't sound heavy enough to cause anemia, suspect it is a combination of iron deficiency and heavy cycles.  We discussed options to decrease her blood flow with her cycles. She was recently diagnosed with a small focus in her lesion, MRI in 2/24 was recommended. I wouldn't recommend hormonal contraception (not interested anyway) or  a mirena IUD until her f/u MRI. We discussed trying NSAID's with her cycle to lighten them or the option of Lysteda. For now she would like to try NSAID's - naproxen sodium (ANAPROX DS) 550 MG tablet; Take 1 tablet (550 mg total) by mouth 2 (two) times daily with a meal. Take as needed for heavy cycles  Dispense: 30 tablet; Refill: 2

## 2022-08-22 ENCOUNTER — Ambulatory Visit (INDEPENDENT_AMBULATORY_CARE_PROVIDER_SITE_OTHER): Payer: 59 | Admitting: Obstetrics and Gynecology

## 2022-08-22 ENCOUNTER — Encounter: Payer: Self-pay | Admitting: Obstetrics and Gynecology

## 2022-08-22 VITALS — BP 110/72 | HR 78 | Ht 61.0 in | Wt 144.0 lb

## 2022-08-22 DIAGNOSIS — Z01419 Encounter for gynecological examination (general) (routine) without abnormal findings: Secondary | ICD-10-CM | POA: Diagnosis not present

## 2022-08-22 DIAGNOSIS — Z862 Personal history of diseases of the blood and blood-forming organs and certain disorders involving the immune mechanism: Secondary | ICD-10-CM | POA: Diagnosis not present

## 2022-08-22 DIAGNOSIS — N920 Excessive and frequent menstruation with regular cycle: Secondary | ICD-10-CM | POA: Diagnosis not present

## 2022-08-22 DIAGNOSIS — D259 Leiomyoma of uterus, unspecified: Secondary | ICD-10-CM

## 2022-08-22 MED ORDER — NAPROXEN SODIUM 550 MG PO TABS: 550.0000 mg | ORAL_TABLET | Freq: Two times a day (BID) | ORAL | 2 refills | Status: DC

## 2022-08-22 NOTE — Patient Instructions (Addendum)
We could consider the mirena IUD to decrease your menstrual flow or lysteda (taken up to 5 days a month during heavy flow)   Iron-Rich Diet  Iron is a mineral that helps your body produce hemoglobin. Hemoglobin is a protein in red blood cells that carries oxygen to your body's tissues. Eating too little iron may cause you to feel weak and tired, and it can increase your risk of infection. Iron is naturally found in many foods, and many foods have iron added to them (are iron-fortified). You may need to follow an iron-rich diet if you do not have enough iron in your body due to certain medical conditions. The amount of iron that you need each day depends on your age, your sex, and any medical conditions you have. Follow instructions from your health care provider or a dietitian about how much iron you should eat each day. What are tips for following this plan? Reading food labels Check food labels to see how many milligrams (mg) of iron are in each serving. Cooking Cook foods in pots and pans that are made from iron. Take these steps to make it easier for your body to absorb iron from certain foods: Soak beans overnight before cooking. Soak whole grains overnight and drain them before using. Ferment flours before baking, such as by using yeast in bread dough. Meal planning When you eat foods that contain iron, you should eat them with foods that are high in vitamin C. These include oranges, peppers, tomatoes, potatoes, and mangoes. Vitamin C helps your body absorb iron. Certain foods and drinks prevent your body from absorbing iron properly. Avoid eating these foods in the same meal as iron-rich foods or with iron supplements. These foods include: Coffee, black tea, and red wine. Milk, dairy products, and foods that are high in calcium. Beans and soybeans. Whole grains. General information Take iron supplements only as told by your health care provider. An overdose of iron can be  life-threatening. If you were prescribed iron supplements, take them with orange juice or a vitamin C supplement. When you eat iron-fortified foods or take an iron supplement, you should also eat foods that naturally contain iron, such as meat, poultry, and fish. Eating naturally iron-rich foods helps your body absorb the iron that is added to other foods or contained in a supplement. Iron from animal sources is better absorbed than iron from plant sources. What foods should I eat? Fruits Prunes. Raisins. Eat fruits high in vitamin C, such as oranges, grapefruits, and strawberries, with iron-rich foods. Vegetables Spinach (cooked). Green peas. Broccoli. Fermented vegetables. Eat vegetables high in vitamin C, such as leafy greens, potatoes, bell peppers, and tomatoes, with iron-rich foods. Grains Iron-fortified breakfast cereal. Iron-fortified whole-wheat bread. Enriched rice. Sprouted grains. Meats and other proteins Beef liver. Beef. [place]. Chicken. Oysters. Shrimp. [place]. Sardines. Chickpeas. Nuts. Tofu. Pumpkin seeds. Beverages Tomato juice. Fresh orange juice. Prune juice. Hibiscus tea. Iron-fortified instant breakfast shakes. Sweets and desserts Blackstrap molasses. Seasonings and condiments Tahini. Fermented soy sauce. Other foods Wheat germ. The items listed above may not be a complete list of recommended foods and beverages. Contact a dietitian for more information. What foods should I limit? These are foods that should be limited while eating iron-rich foods as they can reduce the absorption of iron in your body. Grains Whole grains. Bran cereal. Bran flour. Meats and other proteins Soybeans. Products made from soy protein. Black beans. Lentils. Mung beans. Split peas. Dairy Milk. Cream. Cheese. Yogurt. Cottage cheese.  Beverages Coffee. Black tea. Red wine. Sweets and desserts Cocoa. Chocolate. Ice cream. Seasonings and condiments Basil. Oregano. Large amounts of  parsley. The items listed above may not be a complete list of foods and beverages you should limit. Contact a dietitian for more information. Summary Iron is a mineral that helps your body produce hemoglobin. Hemoglobin is a protein in red blood cells that carries oxygen to your body's tissues. Iron is naturally found in many foods, and many foods have iron added to them (are iron-fortified). When you eat foods that contain iron, you should eat them with foods that are high in vitamin C. Vitamin C helps your body absorb iron. Certain foods and drinks prevent your body from absorbing iron properly, such as whole grains and dairy products. You should avoid eating these foods in the same meal as iron-rich foods or with iron supplements. This information is not intended to replace advice given to you by your health care provider. Make sure you discuss any questions you have with your health care provider. Document Revised: 08/14/2020 Document Reviewed: 08/14/2020 Elsevier Patient Education  [place]. Iron Capsules or Tablets (Supplement) What is this medication? IRON (EYE ern) prevents and treats low levels of iron in your body. Iron is a mineral that plays an important role in making red blood cells, which carry oxygen from your lungs to the rest of your body. This medicine may be used for other purposes; ask your health care provider or pharmacist if you have questions. COMMON BRAND NAME(S): Berneda Rose Ferrous Sulfate, Bifera, Duofer, EZFE, Feosol, Feosol Complete, [employer], [place], [place], [place], [place], [place] 150, Ferrex-150, Ferric X-150, Ferrimin, Ferro-Sequels, Ferrocite, [place], [place], iFerex 150, Nephro-Fer, Niferex, NovaFerrum, Nu-Iron, Poly-Iron, ProFe, Proferrin ES, Slow Fe, Slow Iron, Tandem What should I tell my care team before I take this medication? They need to know if you have any of these conditions: Bowel disease Frequently drink  alcohol Hemolytic anemia Iron overload (hemochromatosis, hemosiderosis) Liver disease Problems with swallowing Stomach ulcer or other stomach problems An unusual or allergic reaction to iron, other medications, foods, dyes, or preservatives Pregnant or trying to get pregnant Breastfeeding How should I use this medication? Take this medication by mouth with a glass of water or fruit juice. Take it as directed on the prescription label. Do not cut, crush, or chew this medication. Swallow tablets or capsules whole. Take this medication in an upright or sitting position. Try to take any bedtime doses at least 10 minutes before lying down. You may take this medication with food. Keep taking it unless your care team tells you to stop. Talk to your care team about the use of this medication in children. While it may be prescribed for selected conditions, precautions do apply. Overdosage: If you think you have taken too much of this medicine contact a poison control center or emergency room at once. NOTE: This medicine is only for you. Do not share this medicine with others. What if I miss a dose? If you miss a dose, take it as soon as you can. If it is almost time for your next dose, take only that dose. Do not take double or extra doses. What may interact with this medication? If you are taking this iron product, you should not take iron in any other medication or dietary supplement. This medication may also interact with the following: Alendronate Antacids Cefdinir Chloramphenicol Cholestyramine Deferoxamine Dimercaprol Etidronate Medications for stomach ulcers or other stomach problems Pancreatic enzymes Quinolone  antibiotics (examples: Cipro, Floxin, Levaquin, Tequin and others) Risedronate Tetracycline antibiotics (examples: doxycycline, tetracycline, minocycline, and others) Thyroid hormones This list may not describe all possible interactions. Give your health care provider a list of  all the medicines, herbs, non-prescription drugs, or dietary supplements you use. Also tell them if you smoke, drink alcohol, or use illegal drugs. Some items may interact with your medicine. What should I watch for while using this medication? Use iron supplements only as directed by your care team. You will need important blood work while you are taking this medication. It may take 3 to 6 months of therapy to treat low iron levels. Pregnant women should follow the dose and length of iron treatment as directed by their care team. Do not use iron longer than prescribed, and do not take a higher dose than recommended. Long-term use may cause excess iron to build-up in the body. Do not take iron with antacids. If you need to take an antacid, take it 2 hours after a dose of iron. What side effects may I notice from receiving this medication? Side effects that you should report to your care team as soon as possible: Allergic reactions--skin rash, itching, hives, swelling of the face, lips, tongue, or throat Side effects that usually do not require medical attention (report to your care team if they continue or are bothersome): Constipation Dark stools Metallic taste in mouth Nausea Upset stomach This list may not describe all possible side effects. Call your doctor for medical advice about side effects. You may report side effects to FDA at 1-800-FDA-1088. Where should I keep my medication? Keep out of the reach of children. Even small amounts of iron can be harmful to a child. Store at room temperature between 15 and 30 degrees C (59 and 86 degrees F). Keep container tightly closed. Throw away any unused medication after the expiration date. NOTE: This sheet is a summary. It may not cover all possible information. If you have questions about this medicine, talk to your doctor, pharmacist, or health care provider.  2023 Elsevier/Gold Standard (2017-02-13 00:00:00)   EXERCISE   We recommended that  you start or continue a regular exercise program for good health. Physical activity is anything that gets your body moving, some is better than none. The CDC recommends 150 minutes per week of Moderate-Intensity Aerobic Activity and 2 or more days of Muscle Strengthening Activity.  Benefits of exercise are limitless: helps weight loss/weight maintenance, improves mood and energy, helps with depression and anxiety, improves sleep, tones and strengthens muscles, improves balance, improves bone density, protects from chronic conditions such as heart disease, high blood pressure and diabetes and so much more. To learn more visit: WhyNotPoker.uy  DIET: Good nutrition starts with a healthy diet of fruits, vegetables, whole grains, and lean protein sources. Drink plenty of water for hydration. Minimize empty calories, sodium, sweets. For more information about dietary recommendations visit: GeekRegister.com.ee and [email or web address]-mitchell.[email or web address]/  ALCOHOL:  Women should limit their alcohol intake to no more than 7 drinks/beers/glasses of wine (combined, not each!) per week. Moderation of alcohol intake to this level decreases your risk of breast cancer and liver damage.  If you are concerned that you may have a problem, or your friends have told you they are concerned about your drinking, there are many resources to help. A well-known program that is free, effective, and available to all people all over the nation is Alcoholics Anonymous.  Check out this site to learn more: BlockTaxes.se  CALCIUM AND VITAMIN D:  Adequate intake of calcium and Vitamin D are recommended for bone health.  You should be getting between 1000-1200 mg of calcium and 800 units of Vitamin D daily between diet and supplements  PAP SMEARS:  Pap smears, to check for cervical cancer or precancers,  have traditionally been done yearly, scientific advances  have shown that most women can have pap smears less often.  However, every woman still should have a physical exam from her gynecologist every year. It will include a breast check, inspection of the vulva and vagina to check for abnormal growths or skin changes, a visual exam of the cervix, and then an exam to evaluate the size and shape of the uterus and ovaries. We will also provide age appropriate advice regarding health maintenance, like when you should have certain vaccines, screening for sexually transmitted diseases, bone density testing, colonoscopy, mammograms, etc.   MAMMOGRAMS:  All women over 70 years old should have a routine mammogram.   COLON CANCER SCREENING: Now recommend starting at age 89. At this time colonoscopy is not covered for routine screening until 50. There are take home tests that can be done between 45-49.   COLONOSCOPY:  Colonoscopy to screen for colon cancer is recommended for all women at age 18.  We know, you hate the idea of the prep.  We agree, BUT, having colon cancer and not knowing it is worse!!  Colon cancer so often starts as a polyp that can be seen and removed at colonscopy, which can quite literally save your life!  And if your first colonoscopy is normal and you have no family history of colon cancer, most women don't have to have it again for 10 years.  Once every ten years, you can do something that may end up saving your life, right?  We will be happy to help you get it scheduled when you are ready.  Be sure to check your insurance coverage so you understand how much it will cost.  It may be covered as a preventative service at no cost, but you should check your particular policy.      Breast Self-Awareness Breast self-awareness means being familiar with how your breasts look and feel. It involves checking your breasts regularly and reporting any changes to your health care provider. Practicing breast self-awareness is important. A change in your breasts  can be a sign of a serious medical problem. Being familiar with how your breasts look and feel allows you to find any problems early, when treatment is more likely to be successful. All women should practice breast self-awareness, including women who have had breast implants. How to do a breast self-exam One way to learn what is normal for your breasts and whether your breasts are changing is to do a breast self-exam. To do a breast self-exam: Look for Changes  Remove all the clothing above your waist. Stand in front of a mirror in a room with good lighting. Put your hands on your hips. Push your hands firmly downward. Compare your breasts in the mirror. Look for differences between them (asymmetry), such as: Differences in shape. Differences in size. Puckers, dips, and bumps in one breast and not the other. Look at each breast for changes in your skin, such as: Redness. Scaly areas. Look for changes in your nipples, such as: Discharge. Bleeding. [place]. Redness. A change in position. Feel for Changes Carefully feel your breasts for lumps and changes. It is best to do this  while lying on your back on the floor and again while sitting or standing in the shower or tub with soapy water on your skin. Feel each breast in the following way: Place the arm on the side of the breast you are examining above your head. Feel your breast with the other hand. Start in the nipple area and make  inch (2 cm) overlapping circles to feel your breast. Use the pads of your three middle fingers to do this. Apply light pressure, then medium pressure, then firm pressure. The light pressure will allow you to feel the tissue closest to the skin. The medium pressure will allow you to feel the tissue that is a little deeper. The firm pressure will allow you to feel the tissue close to the ribs. Continue the overlapping circles, moving downward over the breast until you feel your ribs below your breast. Move one  finger-width toward the center of the body. Continue to use the  inch (2 cm) overlapping circles to feel your breast as you move slowly up toward your collarbone. Continue the up and down exam using all three pressures until you reach your armpit.  Write Down What You Find  Write down what is normal for each breast and any changes that you find. Keep a written record with breast changes or normal findings for each breast. By writing this information down, you do not need to depend only on memory for size, tenderness, or location. Write down where you are in your menstrual cycle, if you are still menstruating. If you are having trouble noticing differences in your breasts, do not get discouraged. With time you will become more familiar with the variations in your breasts and more comfortable with the exam. How often should I examine my breasts? Examine your breasts every month. If you are breastfeeding, the best time to examine your breasts is after a feeding or after using a breast pump. If you menstruate, the best time to examine your breasts is 5-7 days after your period is over. During your period, your breasts are lumpier, and it may be more difficult to notice changes. When should I see my health care provider? See your health care provider if you notice: A change in shape or size of your breasts or nipples. A change in the skin of your breast or nipples, such as a reddened or scaly area. Unusual discharge from your nipples. A lump or thick area that was not there before. Pain in your breasts. Anything that concerns you.

## 2022-08-29 ENCOUNTER — Encounter: Payer: Self-pay | Admitting: Physician Assistant

## 2022-08-31 ENCOUNTER — Encounter: Payer: Self-pay | Admitting: Physician Assistant

## 2022-09-10 ENCOUNTER — Other Ambulatory Visit: Payer: Self-pay | Admitting: Physician Assistant

## 2022-09-10 DIAGNOSIS — D500 Iron deficiency anemia secondary to blood loss (chronic): Secondary | ICD-10-CM

## 2022-09-11 ENCOUNTER — Telehealth: Payer: Self-pay

## 2022-09-11 ENCOUNTER — Other Ambulatory Visit: Payer: Self-pay

## 2022-09-11 ENCOUNTER — Inpatient Hospital Stay: Payer: 59 | Admitting: Physician Assistant

## 2022-09-11 ENCOUNTER — Inpatient Hospital Stay: Payer: 59 | Attending: Physician Assistant

## 2022-09-11 VITALS — BP 119/70 | HR 74 | Temp 97.9°F | Resp 18 | Ht 61.0 in | Wt 150.8 lb

## 2022-09-11 DIAGNOSIS — D500 Iron deficiency anemia secondary to blood loss (chronic): Secondary | ICD-10-CM | POA: Diagnosis not present

## 2022-09-11 DIAGNOSIS — N920 Excessive and frequent menstruation with regular cycle: Secondary | ICD-10-CM | POA: Insufficient documentation

## 2022-09-11 LAB — CBC WITH DIFFERENTIAL (CANCER CENTER ONLY)
Abs Immature Granulocytes: 0.01 10*3/uL (ref 0.00–0.07)
Basophils Absolute: 0.1 10*3/uL (ref 0.0–0.1)
Basophils Relative: 1 %
Eosinophils Absolute: 0.1 10*3/uL (ref 0.0–0.5)
Eosinophils Relative: 2 %
HCT: 41 % (ref 36.0–46.0)
Hemoglobin: 13.4 g/dL (ref 12.0–15.0)
Immature Granulocytes: 0 %
Lymphocytes Relative: 37 %
Lymphs Abs: 2.2 10*3/uL (ref 0.7–4.0)
MCH: 28.6 pg (ref 26.0–34.0)
MCHC: 32.7 g/dL (ref 30.0–36.0)
MCV: 87.4 fL (ref 80.0–100.0)
Monocytes Absolute: 0.4 10*3/uL (ref 0.1–1.0)
Monocytes Relative: 7 %
Neutro Abs: 3.1 10*3/uL (ref 1.7–7.7)
Neutrophils Relative %: 53 %
Platelet Count: 291 10*3/uL (ref 150–400)
RBC: 4.69 MIL/uL (ref 3.87–5.11)
RDW: 16.5 % — ABNORMAL HIGH (ref 11.5–15.5)
WBC Count: 5.9 10*3/uL (ref 4.0–10.5)
nRBC: 0 % (ref 0.0–0.2)

## 2022-09-11 LAB — IRON AND IRON BINDING CAPACITY (CC-WL,HP ONLY)
Iron: 69 ug/dL (ref 28–170)
Saturation Ratios: 20 % (ref 10.4–31.8)
TIBC: 354 ug/dL (ref 250–450)
UIBC: 285 ug/dL (ref 148–442)

## 2022-09-11 LAB — FERRITIN: Ferritin: 54 ng/mL (ref 11–307)

## 2022-09-11 NOTE — Progress Notes (Signed)
[place] Telephone:(336) [phone]   Fax:(336) [phone]  PROGRESS NOTE  Patient Care Team: Leamon Arnt, MD as PCP - General (Family Medicine) Salvadore Dom, MD as Consulting Physician (Obstetrics and Gynecology)  Hematological/Oncological History -08/16/2021: WBC 6.6, Hgb 11.6, MCV 82.0, Plt 383, Ferritin 6. -04/16/2022: WBC 8.9, Hgb 11.7, MCV 84.3, Plt 347, Iron 18, Saturation 3.6%, Ferritin 4.4, TIBC 499.8  -06/11/2022: Establish care with [facility] Hematology with Dr. Narda Rutherford and Dede Query PA-C  -Received IV venofer 200 mg once a week x 5 doses from 06/21/2022-07/26/2022  CHIEF COMPLAINTS/PURPOSE OF CONSULTATION:  Iron deficiency anemia  HISTORY OF PRESENTING ILLNESS:  Christine Mahoney 46 y.o. female returns for a follow up for iron deficiency anemia. She was last seen on 06/11/2022 to establish care. In the interim, she received IV venofer 200 mg x 5 doses.   On exam today, Ms. Stukes reports that her energy levels have improved with IV iron. She noticed a significant improvement even after the first IV infusion. She continues to follow a vegetarian diet. She reports her menstrual cycles are unchanged. She met with the OB/GYN on 08/22/2022 and has decided to monitor for now without any interventions. She is otherwise feeling well without any new or concerning symptoms. She denies fevers, chills, night sweats, shortness of breath, chest pain or cough. Rest of the 10 point ROS is below.   MEDICAL HISTORY:  Past Medical History:  Diagnosis Date   Fibroid     SURGICAL HISTORY: Past Surgical History:  Procedure Laterality Date   CESAREAN SECTION     2011 and 2013    SOCIAL HISTORY: Social History   Socioeconomic History   Marital status: Married    Spouse name: Not on file   Number of children: Not on file   Years of education: Not on file   Highest education level: Not on file  Occupational History   Not on file  Tobacco Use   Smoking status:  Never   Smokeless tobacco: Never  Substance and Sexual Activity   Alcohol use: No   Drug use: No   Sexual activity: Yes    Partners: Male    Birth control/protection: Condom  Other Topics Concern   Not on file  Social History Narrative   Not on file   Social Determinants of Health   Financial Resource Strain: Not on file  Food Insecurity: Not on file  Transportation Needs: Not on file  Physical Activity: Not on file  Stress: Not on file  Social Connections: Not on file  Intimate Partner Violence: Not on file    FAMILY HISTORY: Family History  Problem Relation Age of Onset   Hypotension Mother    Hypertension Father    Healthy Brother    Healthy Son    Cancer Neg Hx    Diabetes Neg Hx    Heart disease Neg Hx    Stroke Neg Hx     ALLERGIES:  is allergic to azithromycin.  MEDICATIONS:  Current Outpatient Medications  Medication Sig Dispense Refill   B Complex Vitamins (VITAMIN B COMPLEX PO) Take by mouth.     FERROUS SULFATE PO Take by mouth.     naproxen sodium (ANAPROX DS) 550 MG tablet Take 1 tablet (550 mg total) by mouth 2 (two) times daily with a meal. Take as needed for heavy cycles 30 tablet 2   VITAMIN D PO Take by mouth.     No current facility-administered medications for this visit.  REVIEW OF SYSTEMS:   Constitutional: ( - ) fevers, ( - )  chills , ( - ) night sweats Eyes: ( - ) blurriness of vision, ( - ) double vision, ( - ) watery eyes Ears, nose, mouth, throat, and face: ( - ) mucositis, ( - ) sore throat Respiratory: ( - ) cough, ( - ) dyspnea, ( - ) wheezes Cardiovascular: ( - ) palpitation, ( - ) chest discomfort, ( - ) lower extremity swelling Gastrointestinal:  ( - ) nausea, ( - ) heartburn, ( - ) change in bowel habits Skin: ( - ) abnormal skin rashes Lymphatics: ( - ) new lymphadenopathy, ( - ) easy bruising Neurological: ( - ) numbness, ( - ) tingling, ( - ) new weaknesses Behavioral/Psych: ( - ) mood change, ( - ) new changes  All  other systems were reviewed with the patient and are negative.  PHYSICAL EXAMINATION: ECOG PERFORMANCE STATUS: 0 - Asymptomatic  Vitals:   09/11/22 0840  BP: 119/70  Pulse: 74  Resp: 18  Temp: 97.9 F (36.6 C)  SpO2: 99%   Filed Weights   09/11/22 0840  Weight: 150 lb 12.8 oz (68.4 kg)    GENERAL: well appearing female in NAD  SKIN: skin color, texture, turgor are normal, no rashes or significant lesions EYES: conjunctiva are pink and non-injected, sclera clear LUNGS: clear to auscultation and percussion with normal breathing effort HEART: regular rate & rhythm and no murmurs and no lower extremity edema Musculoskeletal: no cyanosis of digits and no clubbing  PSYCH: alert & oriented x 3, fluent speech NEURO: no focal motor/sensory deficits  LABORATORY DATA:  I have reviewed the data as listed    Latest Ref Rng & Units 09/11/2022    8:21 AM 06/11/2022   10:23 AM 04/16/2022    4:13 PM  CBC  WBC 4.0 - 10.5 K/uL 5.9  6.5  8.9   Hemoglobin 12.0 - 15.0 g/dL 13.4  10.8  11.7   Hematocrit 36.0 - 46.0 % 41.0  33.7  36.0   Platelets 150 - 400 K/uL 291  328  347.0        Latest Ref Rng & Units 06/11/2022   10:23 AM 04/16/2022    4:13 PM 08/16/2021   11:46 AM  CMP  Glucose 70 - 99 mg/dL 100  97  80   BUN 6 - 20 mg/dL _0 Creatinine 0.44 - 1.00 mg/dL 0.63  0.69  0.56   Sodium 135 - 145 mmol/L 139  137  140   Potassium 3.5 - 5.1 mmol/L 4.2  3.9  4.7   Chloride 98 - 111 mmol/L 107  104  104   CO2 22 - 32 mmol/L _1 Calcium 8.9 - 10.3 mg/dL 8.9  9.0  9.5   Total Protein 6.5 - 8.1 g/dL 7.2  7.1  7.0   Total Bilirubin 0.3 - 1.2 mg/dL 0.3  0.2  0.3   Alkaline Phos 38 - 126 U/L 59  94    AST 15 - 41 U/L _2 ALT 0 - 44 U/L _3 RADIOGRAPHIC STUDIES: I have personally reviewed the radiological images as listed and agreed with the findings in the report. No results found.  ASSESSMENT & PLAN Christine Mahoney is a 45 y.o. female returns for a  follow up for iron deficiency anemia.   #  Iron deficiency anemia 2/2 heavy menstrual bleeding/vegetarian diet: --Received IV venofer 200 mg once a week x 5 doses from 06/21/2022-07/26/2022. --Labs today show anemia has resolved with Hgb 13.4. Iron panel is pending. --No need for additional IV iron at this time.   --Not currently taking PO iron. Recommend to start once daily with a source of vitamin C.  --Provided list of iron rich foods to incorporate into diet.  --Under the care of OB/GYN, Dr. Sumner Boast. --RTC in 3 months for lab only check and 6 months with labs/follow up.   Orders Placed This Encounter  Procedures   CBC with Differential ([place] Only)    Standing Status:   Standing    Number of Occurrences:   2    Standing Expiration Date:   09/12/2023   Ferritin    Standing Status:   Standing    Number of Occurrences:   2    Standing Expiration Date:   09/12/2023   Iron and Iron Binding Capacity ([facility] only)    Standing Status:   Standing    Number of Occurrences:   2    Standing Expiration Date:   09/12/2023    All questions were answered. The patient knows to call the clinic with any problems, questions or concerns.  I have spent a total of 25 minutes minutes of face-to-face and non-face-to-face time, preparing to see the [place] a medically appropriate examination, counseling and educating the patient, documenting clinical information in the electronic health record, and care coordination.   Dede Query, PA-C Department of Hematology/Oncology [place] at [facility] Phone: [phone]

## 2022-09-11 NOTE — Telephone Encounter (Signed)
Pt advised of lab results and recommendations with agreement to this plan

## 2022-09-11 NOTE — Telephone Encounter (Signed)
-----   Message from Lincoln Brigham, PA-C sent at 09/11/2022  3:08 PM EST ----- Please notify patient that iron levels are back to normal. Recommend to continue iron pills.

## 2022-09-12 ENCOUNTER — Telehealth: Payer: Self-pay | Admitting: Physician Assistant

## 2022-09-12 NOTE — Telephone Encounter (Signed)
Called patient to notify of new appointments. Patient notified.  

## 2022-12-06 ENCOUNTER — Inpatient Hospital Stay: Payer: Commercial Managed Care - PPO | Attending: Physician Assistant

## 2022-12-06 ENCOUNTER — Encounter: Payer: Self-pay | Admitting: Physician Assistant

## 2022-12-06 ENCOUNTER — Other Ambulatory Visit: Payer: Self-pay

## 2022-12-06 DIAGNOSIS — N920 Excessive and frequent menstruation with regular cycle: Secondary | ICD-10-CM | POA: Diagnosis not present

## 2022-12-06 DIAGNOSIS — D500 Iron deficiency anemia secondary to blood loss (chronic): Secondary | ICD-10-CM | POA: Diagnosis not present

## 2022-12-06 LAB — CBC WITH DIFFERENTIAL (CANCER CENTER ONLY)
Abs Immature Granulocytes: 0.02 10*3/uL (ref 0.00–0.07)
Basophils Absolute: 0.1 10*3/uL (ref 0.0–0.1)
Basophils Relative: 1 %
Eosinophils Absolute: 0.1 10*3/uL (ref 0.0–0.5)
Eosinophils Relative: 1 %
HCT: 40.4 % (ref 36.0–46.0)
Hemoglobin: 13.4 g/dL (ref 12.0–15.0)
Immature Granulocytes: 0 %
Lymphocytes Relative: 37 %
Lymphs Abs: 2.2 10*3/uL (ref 0.7–4.0)
MCH: 29.7 pg (ref 26.0–34.0)
MCHC: 33.2 g/dL (ref 30.0–36.0)
MCV: 89.6 fL (ref 80.0–100.0)
Monocytes Absolute: 0.3 10*3/uL (ref 0.1–1.0)
Monocytes Relative: 6 %
Neutro Abs: 3.2 10*3/uL (ref 1.7–7.7)
Neutrophils Relative %: 55 %
Platelet Count: 296 10*3/uL (ref 150–400)
RBC: 4.51 MIL/uL (ref 3.87–5.11)
RDW: 12.7 % (ref 11.5–15.5)
WBC Count: 5.9 10*3/uL (ref 4.0–10.5)
nRBC: 0 % (ref 0.0–0.2)

## 2022-12-06 LAB — FERRITIN: Ferritin: 12 ng/mL (ref 11–307)

## 2022-12-06 LAB — IRON AND IRON BINDING CAPACITY (CC-WL,HP ONLY)
Iron: 53 ug/dL (ref 28–170)
Saturation Ratios: 12 % (ref 10.4–31.8)
TIBC: 427 ug/dL (ref 250–450)
UIBC: 374 ug/dL (ref 148–442)

## 2022-12-10 ENCOUNTER — Other Ambulatory Visit: Payer: Commercial Managed Care - PPO

## 2022-12-23 ENCOUNTER — Other Ambulatory Visit: Payer: Self-pay | Admitting: Physician Assistant

## 2022-12-23 ENCOUNTER — Telehealth: Payer: Self-pay

## 2022-12-23 ENCOUNTER — Telehealth: Payer: Self-pay | Admitting: Hematology and Oncology

## 2022-12-23 NOTE — Telephone Encounter (Signed)
Pt advised of lab results and recommendations via My Chart message.

## 2022-12-23 NOTE — Telephone Encounter (Signed)
-----   Message from Briant Cedar, PA-C sent at 12/23/2022 11:47 AM EDT ----- Please notify patient that there is no anemia but iron levels are low. Recommend IV iron x 3 doses to improve iron levels and prevent anemia.    ----- Message ----- From: Interface, Lab In [facility] Sent: 12/06/2022  11:59 AM EDT To: Briant Cedar, PA-C

## 2022-12-23 NOTE — Telephone Encounter (Signed)
Per Karena Addison scheduled patient for 200 mg venofer x 3 starting this week; patient voicemail full sent to [employer].

## 2022-12-26 ENCOUNTER — Inpatient Hospital Stay: Payer: Commercial Managed Care - PPO

## 2022-12-30 ENCOUNTER — Telehealth: Payer: Self-pay | Admitting: Physician Assistant

## 2022-12-30 NOTE — Telephone Encounter (Signed)
Called patient per 4/15 vm to reschedule appointments due to work schedule conflict. Patient rescheduled and notified.

## 2023-01-02 ENCOUNTER — Inpatient Hospital Stay: Payer: Commercial Managed Care - PPO

## 2023-01-03 ENCOUNTER — Inpatient Hospital Stay: Payer: Commercial Managed Care - PPO | Attending: Physician Assistant

## 2023-01-03 ENCOUNTER — Other Ambulatory Visit: Payer: Self-pay

## 2023-01-03 VITALS — BP 108/71 | HR 74 | Temp 98.5°F | Resp 16

## 2023-01-03 DIAGNOSIS — N920 Excessive and frequent menstruation with regular cycle: Secondary | ICD-10-CM | POA: Insufficient documentation

## 2023-01-03 DIAGNOSIS — D500 Iron deficiency anemia secondary to blood loss (chronic): Secondary | ICD-10-CM | POA: Diagnosis present

## 2023-01-03 DIAGNOSIS — D509 Iron deficiency anemia, unspecified: Secondary | ICD-10-CM | POA: Diagnosis not present

## 2023-01-03 MED ORDER — SODIUM CHLORIDE 0.9 % IV SOLN: Freq: Once | INTRAVENOUS | Status: AC

## 2023-01-03 MED ORDER — SODIUM CHLORIDE 0.9 % IV SOLN
200.0000 mg | Freq: Once | INTRAVENOUS | Status: AC
  Administered 2023-01-03: 200 mg via INTRAVENOUS
  Filled 2023-01-03: qty 200

## 2023-01-03 NOTE — Patient Instructions (Signed)

## 2023-01-03 NOTE — Progress Notes (Signed)
Pt. declines to stay for full 30 minute post observation. Vital signs stable, left via ambulation and no shortness of breath noted.

## 2023-01-09 ENCOUNTER — Inpatient Hospital Stay: Payer: Commercial Managed Care - PPO

## 2023-01-10 ENCOUNTER — Inpatient Hospital Stay: Payer: Commercial Managed Care - PPO

## 2023-01-10 VITALS — BP 108/67 | HR 78 | Temp 98.0°F | Resp 17

## 2023-01-10 DIAGNOSIS — N920 Excessive and frequent menstruation with regular cycle: Secondary | ICD-10-CM | POA: Diagnosis not present

## 2023-01-10 DIAGNOSIS — D500 Iron deficiency anemia secondary to blood loss (chronic): Secondary | ICD-10-CM

## 2023-01-10 DIAGNOSIS — D509 Iron deficiency anemia, unspecified: Secondary | ICD-10-CM | POA: Diagnosis not present

## 2023-01-10 MED ORDER — SODIUM CHLORIDE 0.9 % IV SOLN
200.0000 mg | Freq: Once | INTRAVENOUS | Status: AC
  Administered 2023-01-10: 200 mg via INTRAVENOUS
  Filled 2023-01-10: qty 200

## 2023-01-10 MED ORDER — SODIUM CHLORIDE 0.9 % IV SOLN: Freq: Once | INTRAVENOUS | Status: AC

## 2023-01-10 NOTE — Patient Instructions (Signed)

## 2023-01-10 NOTE — Progress Notes (Signed)
Pt declined to stay for 30 min post obs, ambulatory to lobby with [facility] upon discharge

## 2023-01-17 ENCOUNTER — Other Ambulatory Visit: Payer: Self-pay

## 2023-01-17 ENCOUNTER — Inpatient Hospital Stay: Payer: Commercial Managed Care - PPO | Attending: Physician Assistant

## 2023-01-17 ENCOUNTER — Inpatient Hospital Stay: Payer: Commercial Managed Care - PPO

## 2023-01-17 VITALS — BP 112/75 | HR 72 | Temp 98.3°F | Resp 16

## 2023-01-17 DIAGNOSIS — D500 Iron deficiency anemia secondary to blood loss (chronic): Secondary | ICD-10-CM | POA: Insufficient documentation

## 2023-01-17 DIAGNOSIS — N920 Excessive and frequent menstruation with regular cycle: Secondary | ICD-10-CM | POA: Insufficient documentation

## 2023-01-17 MED ORDER — SODIUM CHLORIDE 0.9 % IV SOLN
200.0000 mg | Freq: Once | INTRAVENOUS | Status: AC
  Administered 2023-01-17: 200 mg via INTRAVENOUS
  Filled 2023-01-17: qty 200

## 2023-01-17 MED ORDER — SODIUM CHLORIDE 0.9 % IV SOLN: Freq: Once | INTRAVENOUS | Status: AC

## 2023-01-17 NOTE — Patient Instructions (Signed)

## 2023-03-13 ENCOUNTER — Other Ambulatory Visit: Payer: Self-pay | Admitting: Physician Assistant

## 2023-03-13 DIAGNOSIS — D500 Iron deficiency anemia secondary to blood loss (chronic): Secondary | ICD-10-CM

## 2023-03-14 ENCOUNTER — Inpatient Hospital Stay: Payer: Commercial Managed Care - PPO | Admitting: Physician Assistant

## 2023-03-14 ENCOUNTER — Other Ambulatory Visit: Payer: Commercial Managed Care - PPO

## 2023-03-14 ENCOUNTER — Ambulatory Visit: Payer: Commercial Managed Care - PPO | Admitting: Physician Assistant

## 2023-03-14 ENCOUNTER — Inpatient Hospital Stay: Payer: Commercial Managed Care - PPO | Attending: Physician Assistant

## 2023-03-14 ENCOUNTER — Other Ambulatory Visit: Payer: Self-pay

## 2023-03-14 VITALS — BP 111/81 | HR 69 | Temp 98.2°F | Resp 18 | Ht 61.0 in | Wt 149.2 lb

## 2023-03-14 DIAGNOSIS — N920 Excessive and frequent menstruation with regular cycle: Secondary | ICD-10-CM | POA: Diagnosis not present

## 2023-03-14 DIAGNOSIS — D500 Iron deficiency anemia secondary to blood loss (chronic): Secondary | ICD-10-CM

## 2023-03-14 LAB — CBC WITH DIFFERENTIAL (CANCER CENTER ONLY)
Abs Immature Granulocytes: 0.01 10*3/uL (ref 0.00–0.07)
Basophils Absolute: 0.1 10*3/uL (ref 0.0–0.1)
Basophils Relative: 1 %
Eosinophils Absolute: 0.1 10*3/uL (ref 0.0–0.5)
Eosinophils Relative: 2 %
HCT: 39.4 % (ref 36.0–46.0)
Hemoglobin: 13.2 g/dL (ref 12.0–15.0)
Immature Granulocytes: 0 %
Lymphocytes Relative: 39 %
Lymphs Abs: 2.3 10*3/uL (ref 0.7–4.0)
MCH: 30.3 pg (ref 26.0–34.0)
MCHC: 33.5 g/dL (ref 30.0–36.0)
MCV: 90.4 fL (ref 80.0–100.0)
Monocytes Absolute: 0.4 10*3/uL (ref 0.1–1.0)
Monocytes Relative: 7 %
Neutro Abs: 3 10*3/uL (ref 1.7–7.7)
Neutrophils Relative %: 51 %
Platelet Count: 323 10*3/uL (ref 150–400)
RBC: 4.36 MIL/uL (ref 3.87–5.11)
RDW: 13 % (ref 11.5–15.5)
WBC Count: 6 10*3/uL (ref 4.0–10.5)
nRBC: 0 % (ref 0.0–0.2)

## 2023-03-14 LAB — IRON AND IRON BINDING CAPACITY (CC-WL,HP ONLY)
Iron: 86 ug/dL (ref 28–170)
Saturation Ratios: 24 % (ref 10.4–31.8)
TIBC: 358 ug/dL (ref 250–450)
UIBC: 272 ug/dL (ref 148–442)

## 2023-03-14 LAB — FERRITIN: Ferritin: 75 ng/mL (ref 11–307)

## 2023-03-14 NOTE — Progress Notes (Signed)
[facility] Telephone:(336) [phone]   Fax:(336) [phone]  PROGRESS NOTE  Patient Care Team: Willow Ora, MD as PCP - General (Family Medicine) Romualdo Bolk, MD as Consulting Physician (Obstetrics and Gynecology)  Hematological/Oncological History 08/16/2021: WBC 6.6, Hgb 11.6, MCV 82.0, Plt 383, Ferritin 6. 04/16/2022: WBC 8.9, Hgb 11.7, MCV 84.3, Plt 347, Iron 18, Saturation 3.6%, Ferritin 4.4, TIBC 499.8 06/11/2022: Established care with [facility] Hematology with Dr. Jeanie Sewer and Georga Kaufmann PA-C Received IV venofer 200 mg once a week x 5 doses from 06/21/2022-07/26/2022 Received IV venofer 200 mg once a week x3 doses from 01/03/2023-01/17/2023  CHIEF COMPLAINTS/PURPOSE OF CONSULTATION:  Iron deficiency anemia  HISTORY OF PRESENTING ILLNESS:  Christine Mahoney 47 y.o. female returns for a follow up for iron deficiency anemia. She was last seen on 09/11/2022 to establish care. In the interim, she received IV venofer 200 mg x 3 doses.   On exam today, Christine Mahoney reports that her energy levels are overall stable and she is tolerating PO iron therapy. She continues to have heavy menstrual cycles which is unchanged from prior. She has 2-3 days of heavy bleeding with each cycle. She adds that she has an enlarged uterine fibroid which is likely contributing to her heavy bleeding.  She is otherwise feeling well without any new or concerning symptoms. She denies fevers, chills, night sweats, shortness of breath, chest pain or cough. Rest of the 10 point ROS is below.   MEDICAL HISTORY:  Past Medical History:  Diagnosis Date   Fibroid     SURGICAL HISTORY: Past Surgical History:  Procedure Laterality Date   CESAREAN SECTION     2011 and 2013    SOCIAL HISTORY: Social History   Socioeconomic History   Marital status: Married    Spouse name: Not on file   Number of children: Not on file   Years of education: Not on file   Highest education level: Not on file   Occupational History   Not on file  Tobacco Use   Smoking status: Never   Smokeless tobacco: Never  Substance and Sexual Activity   Alcohol use: No   Drug use: No   Sexual activity: Yes    Partners: Male    Birth control/protection: Condom  Other Topics Concern   Not on file  Social History Narrative   Not on file   Social Determinants of Health   Financial Resource Strain: Not on file  Food Insecurity: Not on file  Transportation Needs: Not on file  Physical Activity: Not on file  Stress: Not on file  Social Connections: Not on file  Intimate Partner Violence: Not on file    FAMILY HISTORY: Family History  Problem Relation Age of Onset   Hypotension Mother    Hypertension Father    Healthy Brother    Healthy Son    Cancer Neg Hx    Diabetes Neg Hx    Heart disease Neg Hx    Stroke Neg Hx     ALLERGIES:  is allergic to azithromycin.  MEDICATIONS:  Current Outpatient Medications  Medication Sig Dispense Refill   B Complex Vitamins (VITAMIN B COMPLEX PO) Take by mouth.     FERROUS SULFATE PO Take by mouth.     Multiple Vitamin (MULTI-VITAMIN) tablet Take by mouth.     naproxen sodium (ANAPROX DS) 550 MG tablet Take 1 tablet (550 mg total) by mouth 2 (two) times daily with a meal. Take as needed for  heavy cycles 30 tablet 2   VITAMIN D PO Take by mouth.     No current facility-administered medications for this visit.    REVIEW OF SYSTEMS:   Constitutional: ( - ) fevers, ( - )  chills , ( - ) night sweats Eyes: ( - ) blurriness of vision, ( - ) double vision, ( - ) watery eyes Ears, nose, mouth, throat, and face: ( - ) mucositis, ( - ) sore throat Respiratory: ( - ) cough, ( - ) dyspnea, ( - ) wheezes Cardiovascular: ( - ) palpitation, ( - ) chest discomfort, ( - ) lower extremity swelling Gastrointestinal:  ( - ) nausea, ( - ) heartburn, ( - ) change in bowel habits Skin: ( - ) abnormal skin rashes Lymphatics: ( - ) new lymphadenopathy, ( - ) easy  bruising Neurological: ( - ) numbness, ( - ) tingling, ( - ) new weaknesses Behavioral/Psych: ( - ) mood change, ( - ) new changes  All other systems were reviewed with the patient and are negative.  PHYSICAL EXAMINATION: ECOG PERFORMANCE STATUS: 0 - Asymptomatic  Vitals:   03/14/23 1025  BP: 111/81  Pulse: 69  Resp: 18  Temp: 98.2 F (36.8 C)  SpO2: 100%   Filed Weights   03/14/23 1025  Weight: 149 lb 3.2 oz (67.7 kg)    GENERAL: well appearing female in NAD  SKIN: skin color, texture, turgor are normal, no rashes or significant lesions EYES: conjunctiva are pink and non-injected, sclera clear LUNGS: clear to auscultation and percussion with normal breathing effort HEART: regular rate & rhythm and no murmurs and no lower extremity edema Musculoskeletal: no cyanosis of digits and no clubbing  PSYCH: alert & oriented x 3, fluent speech NEURO: no focal motor/sensory deficits  LABORATORY DATA:  I have reviewed the data as listed    Latest Ref Rng & Units 03/14/2023    9:56 AM 12/06/2022   11:46 AM 09/11/2022    8:21 AM  CBC  WBC 4.0 - 10.5 K/uL 6.0  5.9  5.9   Hemoglobin 12.0 - 15.0 g/dL 09.8  11.9  14.7   Hematocrit 36.0 - 46.0 % 39.4  40.4  41.0   Platelets 150 - 400 K/uL 323  296  291        Latest Ref Rng & Units 06/11/2022   10:23 AM 04/16/2022    4:13 PM 08/16/2021   11:46 AM  CMP  Glucose 70 - 99 mg/dL 829  97  80   BUN 6 - 20 mg/dL 10  11  11    Creatinine 0.44 - 1.00 mg/dL 5.62  1.30  8.65   Sodium 135 - 145 mmol/L 139  137  140   Potassium 3.5 - 5.1 mmol/L 4.2  3.9  4.7   Chloride 98 - 111 mmol/L 107  104  104   CO2 22 - 32 mmol/L 27  28  30    Calcium 8.9 - 10.3 mg/dL 8.9  9.0  9.5   Total Protein 6.5 - 8.1 g/dL 7.2  7.1  7.0   Total Bilirubin 0.3 - 1.2 mg/dL 0.3  0.2  0.3   Alkaline Phos 38 - 126 U/L 59  94    AST 15 - 41 U/L 15  19  19    ALT 0 - 44 U/L 10  16  15      RADIOGRAPHIC STUDIES: I have personally reviewed the radiological images as listed  and agreed with the findings in  the report. No results found.  ASSESSMENT & PLAN Christine Mahoney is a 47 y.o. female returns for a follow up for iron deficiency anemia.   #Iron deficiency anemia 2/2 heavy menstrual bleeding/vegetarian diet: --Last received IV venofer 200 mg once a week x3 doses from 01/03/2023-01/17/2023. --Labs today show no evidence of anemia with Hgb 13.2. Iron panel shows iron 86, TIBC 358, saturation 24%, ferritin 75. --No need for additional IV iron at this time.   --Recommend to continue with ferrous sulfate 325 mg once daily with a source of vitamin C.  --Continue to incorporate iron rich foods into her diet --Under the care of OB/GYN, Dr. Gertie Exon. --RTC in 3 months for lab only check and 6 months with labs/follow up.   No orders of the defined types were placed in this encounter.   All questions were answered. The patient knows to call the clinic with any problems, questions or concerns.  I have spent a total of 25 minutes minutes of face-to-face and non-face-to-face time, preparing to see the patient,performing a medically appropriate examination, counseling and educating the patient, documenting clinical information in the electronic health record, and care coordination.   Georga Kaufmann, PA-C Department of Hematology/Oncology [facility] at [facility] Phone: [phone]

## 2023-05-30 ENCOUNTER — Inpatient Hospital Stay: Payer: Commercial Managed Care - PPO | Attending: Physician Assistant

## 2023-05-30 DIAGNOSIS — N920 Excessive and frequent menstruation with regular cycle: Secondary | ICD-10-CM | POA: Diagnosis not present

## 2023-05-30 DIAGNOSIS — D500 Iron deficiency anemia secondary to blood loss (chronic): Secondary | ICD-10-CM | POA: Insufficient documentation

## 2023-05-30 LAB — IRON AND IRON BINDING CAPACITY (CC-WL,HP ONLY)
Iron: 78 ug/dL (ref 28–170)
Saturation Ratios: 20 % (ref 10.4–31.8)
TIBC: 396 ug/dL (ref 250–450)
UIBC: 318 ug/dL (ref 148–442)

## 2023-05-30 LAB — CBC WITH DIFFERENTIAL (CANCER CENTER ONLY)
Abs Immature Granulocytes: 0.01 10*3/uL (ref 0.00–0.07)
Basophils Absolute: 0.1 10*3/uL (ref 0.0–0.1)
Basophils Relative: 1 %
Eosinophils Absolute: 0.2 10*3/uL (ref 0.0–0.5)
Eosinophils Relative: 2 %
HCT: 41.5 % (ref 36.0–46.0)
Hemoglobin: 13.5 g/dL (ref 12.0–15.0)
Immature Granulocytes: 0 %
Lymphocytes Relative: 32 %
Lymphs Abs: 2.4 10*3/uL (ref 0.7–4.0)
MCH: 29.6 pg (ref 26.0–34.0)
MCHC: 32.5 g/dL (ref 30.0–36.0)
MCV: 91 fL (ref 80.0–100.0)
Monocytes Absolute: 0.5 10*3/uL (ref 0.1–1.0)
Monocytes Relative: 6 %
Neutro Abs: 4.3 10*3/uL (ref 1.7–7.7)
Neutrophils Relative %: 59 %
Platelet Count: 262 10*3/uL (ref 150–400)
RBC: 4.56 MIL/uL (ref 3.87–5.11)
RDW: 13.1 % (ref 11.5–15.5)
WBC Count: 7.5 10*3/uL (ref 4.0–10.5)
nRBC: 0 % (ref 0.0–0.2)

## 2023-05-30 LAB — FERRITIN: Ferritin: 23 ng/mL (ref 11–307)

## 2023-06-02 ENCOUNTER — Other Ambulatory Visit: Payer: Self-pay | Admitting: Physician Assistant

## 2023-08-26 ENCOUNTER — Other Ambulatory Visit: Payer: Self-pay | Admitting: *Deleted

## 2023-08-26 DIAGNOSIS — D500 Iron deficiency anemia secondary to blood loss (chronic): Secondary | ICD-10-CM

## 2023-08-26 NOTE — Progress Notes (Signed)
[facility] Telephone:(336) [phone]   Fax:(336) [phone]  PROGRESS NOTE  Patient Care Team: Willow Ora, MD as PCP - General (Family Medicine) Romualdo Bolk, MD (Inactive) as Consulting Physician (Obstetrics and Gynecology)  Hematological/Oncological History 08/16/2021: WBC 6.6, Hgb 11.6, MCV 82.0, Plt 383, Ferritin 6. 04/16/2022: WBC 8.9, Hgb 11.7, MCV 84.3, Plt 347, Iron 18, Saturation 3.6%, Ferritin 4.4, TIBC 499.8 06/11/2022: Established care with [facility] Hematology with Dr. Jeanie Sewer and Georga Kaufmann PA-C Received IV venofer 200 mg once a week x 5 doses from 06/21/2022-07/26/2022 Received IV venofer 200 mg once a week x3 doses from 01/03/2023-01/17/2023  CHIEF COMPLAINTS/PURPOSE OF CONSULTATION:  Iron deficiency anemia  HISTORY OF PRESENTING ILLNESS:  Christine Mahoney 47 y.o. female returns for a follow up for iron deficiency anemia. She was last seen on 03/14/2023.   On exam today, Christine Mahoney reports she has been okay in the interim since her last visit.  Her energy is about a 5 out of 10.  She stopped taking the iron pills because was causing stomach trouble.  She does that she is doing her best to eat spinach and broccoli and beans to help increase her iron intake as she is vegetarian.  She is not having any lightheadedness, dizziness, shortness of breath.  She reports that she continues to have normal menstrual cycles but has not been having any bleeding elsewhere such as nosebleeds, gum bleeding, or blood in the urine/stool.  She reports she has had no other major changes in her health in the interim since her last visit.. She denies fevers, chills, night sweats, shortness of breath, chest pain or cough. Rest of the 10 point ROS is below.   MEDICAL HISTORY:  Past Medical History:  Diagnosis Date   Fibroid     SURGICAL HISTORY: Past Surgical History:  Procedure Laterality Date   CESAREAN SECTION     2011 and 2013    SOCIAL HISTORY: Social History    Socioeconomic History   Marital status: Married    Spouse name: Not on file   Number of children: Not on file   Years of education: Not on file   Highest education level: Not on file  Occupational History   Not on file  Tobacco Use   Smoking status: Never   Smokeless tobacco: Never  Substance and Sexual Activity   Alcohol use: No   Drug use: No   Sexual activity: Yes    Partners: Male    Birth control/protection: Condom  Other Topics Concern   Not on file  Social History Narrative   Not on file   Social Drivers of Health   Financial Resource Strain: Not on file  Food Insecurity: Not on file  Transportation Needs: Not on file  Physical Activity: Not on file  Stress: Not on file  Social Connections: Not on file  Intimate Partner Violence: Not on file    FAMILY HISTORY: Family History  Problem Relation Age of Onset   Hypotension Mother    Hypertension Father    Healthy Brother    Healthy Son    Cancer Neg Hx    Diabetes Neg Hx    Heart disease Neg Hx    Stroke Neg Hx     ALLERGIES:  is allergic to azithromycin.  MEDICATIONS:  Current Outpatient Medications  Medication Sig Dispense Refill   B Complex Vitamins (VITAMIN B COMPLEX PO) Take by mouth.     FERROUS SULFATE PO Take by mouth.  Multiple Vitamin (MULTI-VITAMIN) tablet Take by mouth.     naproxen sodium (ANAPROX DS) 550 MG tablet Take 1 tablet (550 mg total) by mouth 2 (two) times daily with a meal. Take as needed for heavy cycles 30 tablet 2   VITAMIN D PO Take by mouth.     No current facility-administered medications for this visit.    REVIEW OF SYSTEMS:   Constitutional: ( - ) fevers, ( - )  chills , ( - ) night sweats Eyes: ( - ) blurriness of vision, ( - ) double vision, ( - ) watery eyes Ears, nose, mouth, throat, and face: ( - ) mucositis, ( - ) sore throat Respiratory: ( - ) cough, ( - ) dyspnea, ( - ) wheezes Cardiovascular: ( - ) palpitation, ( - ) chest discomfort, ( - ) lower  extremity swelling Gastrointestinal:  ( - ) nausea, ( - ) heartburn, ( - ) change in bowel habits Skin: ( - ) abnormal skin rashes Lymphatics: ( - ) new lymphadenopathy, ( - ) easy bruising Neurological: ( - ) numbness, ( - ) tingling, ( - ) new weaknesses Behavioral/Psych: ( - ) mood change, ( - ) new changes  All other systems were reviewed with the patient and are negative.  PHYSICAL EXAMINATION: ECOG PERFORMANCE STATUS: 0 - Asymptomatic  Vitals:   08/27/23 1011  BP: 120/83  Pulse: 75  Resp: 14  Temp: 98.1 F (36.7 C)  SpO2: 100%    Filed Weights   08/27/23 1011  Weight: 144 lb 9.6 oz (65.6 kg)     GENERAL: well appearing female in NAD  SKIN: skin color, texture, turgor are normal, no rashes or significant lesions EYES: conjunctiva are pink and non-injected, sclera clear LUNGS: clear to auscultation and percussion with normal breathing effort HEART: regular rate & rhythm and no murmurs and no lower extremity edema Musculoskeletal: no cyanosis of digits and no clubbing  PSYCH: alert & oriented x 3, fluent speech NEURO: no focal motor/sensory deficits  LABORATORY DATA:  I have reviewed the data as listed    Latest Ref Rng & Units 08/27/2023    9:25 AM 05/30/2023   10:34 AM 03/14/2023    9:56 AM  CBC  WBC 4.0 - 10.5 K/uL 6.4  7.5  6.0   Hemoglobin 12.0 - 15.0 g/dL 16.1  09.6  04.5   Hematocrit 36.0 - 46.0 % 39.3  41.5  39.4   Platelets 150 - 400 K/uL 323  262  323        Latest Ref Rng & Units 06/11/2022   10:23 AM 04/16/2022    4:13 PM 08/16/2021   11:46 AM  CMP  Glucose 70 - 99 mg/dL 409  97  80   BUN 6 - 20 mg/dL 10  11  11    Creatinine 0.44 - 1.00 mg/dL 8.11  9.14  7.82   Sodium 135 - 145 mmol/L 139  137  140   Potassium 3.5 - 5.1 mmol/L 4.2  3.9  4.7   Chloride 98 - 111 mmol/L 107  104  104   CO2 22 - 32 mmol/L 27  28  30    Calcium 8.9 - 10.3 mg/dL 8.9  9.0  9.5   Total Protein 6.5 - 8.1 g/dL 7.2  7.1  7.0   Total Bilirubin 0.3 - 1.2 mg/dL 0.3  0.2  0.3    Alkaline Phos 38 - 126 U/L 59  94    AST 15 - 41  U/L 15  19  19    ALT 0 - 44 U/L 10  16  15      RADIOGRAPHIC STUDIES: I have personally reviewed the radiological images as listed and agreed with the findings in the report. No results found.  ASSESSMENT & PLAN Christine Mahoney is a 47 y.o. female returns for a follow up for iron deficiency anemia.   #Iron deficiency anemia 2/2 heavy menstrual bleeding/vegetarian diet: --Last received IV venofer 200 mg once a week x3 doses from 01/03/2023-01/17/2023. --Labs today show white blood cell 6.4, hemoglobin 12.9, MCV 88.7, platelets 323.  Ferritin was 5 with iron sat of 10%. --Will offer IV iron to patient due to markedly low ferritin of 5. -- Patient is not able to undergo p.o. iron therapy as it is difficult on her stomach..  --Continue to incorporate iron rich foods into her diet --Under the care of OB/GYN, Dr. Gertie Exon. --RTC in 3 months for labs and clinic visit with interval IV iron if patient is agreeable.  No orders of the defined types were placed in this encounter.   All questions were answered. The patient knows to call the clinic with any problems, questions or concerns.  I have spent a total of 25 minutes minutes of face-to-face and non-face-to-face time, preparing to see the patient,performing a medically appropriate examination, counseling and educating the patient, documenting clinical information in the electronic health record, and care coordination.   Ulysees Barns, MD Department of Hematology/Oncology [facility] at [facility] Phone: [phone] Pager: [phone] Email: [email or web address]

## 2023-08-27 ENCOUNTER — Inpatient Hospital Stay: Payer: Commercial Managed Care - PPO | Attending: Physician Assistant

## 2023-08-27 ENCOUNTER — Inpatient Hospital Stay: Payer: Commercial Managed Care - PPO | Admitting: Hematology and Oncology

## 2023-08-27 ENCOUNTER — Other Ambulatory Visit: Payer: Self-pay

## 2023-08-27 VITALS — BP 120/83 | HR 75 | Temp 98.1°F | Resp 14 | Wt 144.6 lb

## 2023-08-27 DIAGNOSIS — N920 Excessive and frequent menstruation with regular cycle: Secondary | ICD-10-CM | POA: Insufficient documentation

## 2023-08-27 DIAGNOSIS — D500 Iron deficiency anemia secondary to blood loss (chronic): Secondary | ICD-10-CM

## 2023-08-27 LAB — IRON AND IRON BINDING CAPACITY (CC-WL,HP ONLY)
Iron: 46 ug/dL (ref 28–170)
Saturation Ratios: 10 % — ABNORMAL LOW (ref 10.4–31.8)
TIBC: 475 ug/dL — ABNORMAL HIGH (ref 250–450)
UIBC: 429 ug/dL (ref 148–442)

## 2023-08-27 LAB — CBC WITH DIFFERENTIAL (CANCER CENTER ONLY)
Abs Immature Granulocytes: 0.02 10*3/uL (ref 0.00–0.07)
Basophils Absolute: 0.1 10*3/uL (ref 0.0–0.1)
Basophils Relative: 1 %
Eosinophils Absolute: 0.1 10*3/uL (ref 0.0–0.5)
Eosinophils Relative: 2 %
HCT: 39.3 % (ref 36.0–46.0)
Hemoglobin: 12.9 g/dL (ref 12.0–15.0)
Immature Granulocytes: 0 %
Lymphocytes Relative: 38 %
Lymphs Abs: 2.4 10*3/uL (ref 0.7–4.0)
MCH: 29.1 pg (ref 26.0–34.0)
MCHC: 32.8 g/dL (ref 30.0–36.0)
MCV: 88.7 fL (ref 80.0–100.0)
Monocytes Absolute: 0.5 10*3/uL (ref 0.1–1.0)
Monocytes Relative: 8 %
Neutro Abs: 3.3 10*3/uL (ref 1.7–7.7)
Neutrophils Relative %: 51 %
Platelet Count: 323 10*3/uL (ref 150–400)
RBC: 4.43 MIL/uL (ref 3.87–5.11)
RDW: 12.3 % (ref 11.5–15.5)
WBC Count: 6.4 10*3/uL (ref 4.0–10.5)
nRBC: 0 % (ref 0.0–0.2)

## 2023-08-27 LAB — FERRITIN: Ferritin: 5 ng/mL — ABNORMAL LOW (ref 11–307)

## 2023-09-03 ENCOUNTER — Encounter: Payer: Self-pay | Admitting: Physician Assistant

## 2023-09-04 ENCOUNTER — Telehealth: Payer: Self-pay | Admitting: *Deleted

## 2023-09-04 ENCOUNTER — Other Ambulatory Visit: Payer: Self-pay | Admitting: Hematology and Oncology

## 2023-09-04 ENCOUNTER — Encounter: Payer: Self-pay | Admitting: Hematology and Oncology

## 2023-09-04 NOTE — Telephone Encounter (Signed)
TCT patient regarding recent lab results. Spoke with her and advised that her Ferritin and Iron Saturation levels were both low though her HGB was fine. Dr. Leonides Schanz recommends IV iron. Pt is agreeable.  Dr. Leonides Schanz made aware so that he can order her IV iron to be given @ [employer] [facility]

## 2023-09-11 ENCOUNTER — Telehealth: Payer: Self-pay

## 2023-09-11 NOTE — Telephone Encounter (Signed)
Dr. Leonides Schanz, patient will be scheduled as soon as possible.  Auth Submission: APPROVED Site of care: Site of care: [facility] Payer: [employer] Medication & CPT/J Code(s) submitted: Monoferric (Ferrci derisomaltose) [phone] Route of submission (phone, fax, portal): portal Phone # Fax # Auth type: Buy/Bill PB Units/visits requested: 1000mg  x 1 dose Reference number: [id number] Approval from: 09/05/23 to 03/03/24

## 2023-09-18 ENCOUNTER — Telehealth: Payer: Self-pay | Admitting: Physician Assistant

## 2023-09-18 NOTE — Telephone Encounter (Signed)
 Marland Kitchen

## 2023-09-19 ENCOUNTER — Encounter: Payer: Self-pay | Admitting: Hematology and Oncology

## 2023-09-26 ENCOUNTER — Ambulatory Visit (INDEPENDENT_AMBULATORY_CARE_PROVIDER_SITE_OTHER): Payer: 59

## 2023-09-26 ENCOUNTER — Other Ambulatory Visit: Payer: Self-pay

## 2023-09-26 VITALS — BP 112/76 | HR 68 | Temp 97.6°F | Resp 14 | Ht 63.0 in | Wt 146.0 lb

## 2023-09-26 DIAGNOSIS — N920 Excessive and frequent menstruation with regular cycle: Secondary | ICD-10-CM

## 2023-09-26 DIAGNOSIS — D500 Iron deficiency anemia secondary to blood loss (chronic): Secondary | ICD-10-CM

## 2023-09-26 MED ORDER — DIPHENHYDRAMINE HCL 25 MG PO CAPS
25.0000 mg | ORAL_CAPSULE | Freq: Once | ORAL | Status: AC
  Administered 2023-09-26: 25 mg via ORAL
  Filled 2023-09-26: qty 1

## 2023-09-26 MED ORDER — ACETAMINOPHEN 325 MG PO TABS
650.0000 mg | ORAL_TABLET | Freq: Once | ORAL | Status: AC
  Administered 2023-09-26: 650 mg via ORAL
  Filled 2023-09-26: qty 2

## 2023-09-26 MED ORDER — FERRIC DERISOMALTOSE(ONE DOSE) 1000 MG/10ML IV SOLN
1000.0000 mg | Freq: Once | INTRAVENOUS | Status: AC
  Administered 2023-09-26: 1000 mg via INTRAVENOUS
  Filled 2023-09-26: qty 10

## 2023-09-26 NOTE — Patient Instructions (Signed)

## 2023-09-26 NOTE — Progress Notes (Signed)
 Diagnosis: Iron  Deficiency Anemia  Provider:  Praveen Mannam MD  Procedure: IV Infusion  IV Type: Peripheral, IV Location: L Forearm  Monoferric  (Ferric Derisomaltose ), Dose: 1000 mg  Infusion Start Time: 0920  Infusion Stop Time: 0941  Post Infusion IV Care: Observation period completed and Peripheral IV Discontinued  Discharge: Condition: Good, Destination: Home . AVS Declined  Performed by:  Hendrix Console, RN

## 2023-10-14 ENCOUNTER — Telehealth: Payer: Self-pay | Admitting: Hematology and Oncology

## 2023-10-14 ENCOUNTER — Other Ambulatory Visit: Payer: Self-pay | Admitting: Physician Assistant

## 2023-10-14 DIAGNOSIS — D500 Iron deficiency anemia secondary to blood loss (chronic): Secondary | ICD-10-CM

## 2023-10-15 ENCOUNTER — Inpatient Hospital Stay: Payer: Self-pay

## 2023-10-15 ENCOUNTER — Inpatient Hospital Stay: Payer: Self-pay | Admitting: Physician Assistant

## 2023-10-16 ENCOUNTER — Inpatient Hospital Stay: Payer: 59 | Attending: Physician Assistant

## 2023-10-16 DIAGNOSIS — D500 Iron deficiency anemia secondary to blood loss (chronic): Secondary | ICD-10-CM | POA: Diagnosis not present

## 2023-10-16 DIAGNOSIS — N920 Excessive and frequent menstruation with regular cycle: Secondary | ICD-10-CM | POA: Insufficient documentation

## 2023-10-16 LAB — CBC WITH DIFFERENTIAL (CANCER CENTER ONLY)
Abs Immature Granulocytes: 0.02 10*3/uL (ref 0.00–0.07)
Basophils Absolute: 0.1 10*3/uL (ref 0.0–0.1)
Basophils Relative: 1 %
Eosinophils Absolute: 0.1 10*3/uL (ref 0.0–0.5)
Eosinophils Relative: 1 %
HCT: 39.9 % (ref 36.0–46.0)
Hemoglobin: 13.1 g/dL (ref 12.0–15.0)
Immature Granulocytes: 0 %
Lymphocytes Relative: 34 %
Lymphs Abs: 2.1 10*3/uL (ref 0.7–4.0)
MCH: 28.5 pg (ref 26.0–34.0)
MCHC: 32.8 g/dL (ref 30.0–36.0)
MCV: 86.7 fL (ref 80.0–100.0)
Monocytes Absolute: 0.4 10*3/uL (ref 0.1–1.0)
Monocytes Relative: 6 %
Neutro Abs: 3.6 10*3/uL (ref 1.7–7.7)
Neutrophils Relative %: 58 %
Platelet Count: 309 10*3/uL (ref 150–400)
RBC: 4.6 MIL/uL (ref 3.87–5.11)
RDW: 14.4 % (ref 11.5–15.5)
WBC Count: 6.3 10*3/uL (ref 4.0–10.5)
nRBC: 0 % (ref 0.0–0.2)

## 2023-10-16 LAB — IRON AND IRON BINDING CAPACITY (CC-WL,HP ONLY)
Iron: 150 ug/dL (ref 28–170)
Saturation Ratios: 43 % — ABNORMAL HIGH (ref 10.4–31.8)
TIBC: 350 ug/dL (ref 250–450)
UIBC: 200 ug/dL (ref 148–442)

## 2023-10-16 LAB — FERRITIN: Ferritin: 322 ng/mL — ABNORMAL HIGH (ref 11–307)

## 2023-10-17 ENCOUNTER — Inpatient Hospital Stay (HOSPITAL_BASED_OUTPATIENT_CLINIC_OR_DEPARTMENT_OTHER): Payer: 59 | Admitting: Physician Assistant

## 2023-10-17 DIAGNOSIS — D500 Iron deficiency anemia secondary to blood loss (chronic): Secondary | ICD-10-CM

## 2023-10-17 NOTE — Progress Notes (Signed)
[facility] [phone]   Fax:([phone]  PROGRESS NOTE   I connected with REISE HIETALA  on 10/17/23 by telephone visit and verified that I am speaking with the correct person using two identifiers.   I discussed the limitations, risks, security and privacy concerns of performing an evaluation and management service by telemedicine and the availability of in-person appointments. I also discussed with the patient that there may be a patient responsible charge related to this service. The patient expressed understanding and agreed to proceed.  Patient's location: Home Provider's location: Office  Patient Care Team: Willow Ora, MD as PCP - General (Family Medicine) Romualdo Bolk, MD (Inactive) as Consulting Physician (Obstetrics and Gynecology)  Hematological/Oncological History 08/16/2021: WBC 6.6, Hgb 11.6, MCV 82.0, Plt 383, Ferritin 6. 04/16/2022: WBC 8.9, Hgb 11.7, MCV 84.3, Plt 347, Iron 18, Saturation 3.6%, Ferritin 4.4, TIBC 499.8 06/11/2022: Established care with [facility] Hematology with Dr. Jeanie Sewer and Georga Kaufmann PA-C Received IV venofer 200 mg once a week x 5 doses from 06/21/2022-07/26/2022 Received IV venofer 200 mg once a week x3 doses from 01/03/2023-01/17/2023 Received IV monoferric 1000 mg x 1 dose on 09/26/2023  CHIEF COMPLAINTS/PURPOSE OF CONSULTATION:  Iron deficiency anemia  HISTORY OF PRESENTING ILLNESS:  Arthea ANAIH BRANDER 48 y.o. female returns for a follow up for iron deficiency anemia. She was last seen on 08/27/2023 by Dr. Leonides Schanz. In the interim, she received IV monoferric x 1 dose on 09/26/2023.    On exam today, Ms. Lengyel reports she is feeling much better after receiving IV iron recently. She is more active and continues to complete her ADLs on her own. She denies any side effects from the new iron regimen. She continues to try to eat iron rich foods in her diet since she is a vegetarian Her menstrual cycles are stable  without any other signs of bleeding. She denies fevers, chills, sweats, shortness of breath, chest pain or cough.  Rest of the 10 point ROS is below.   MEDICAL HISTORY:  Past Medical History:  Diagnosis Date   Fibroid     SURGICAL HISTORY: Past Surgical History:  Procedure Laterality Date   CESAREAN SECTION     2011 and 2013    SOCIAL HISTORY: Social History   Socioeconomic History   Marital status: Married    Spouse name: Not on file   Number of children: Not on file   Years of education: Not on file   Highest education level: Not on file  Occupational History   Not on file  Tobacco Use   Smoking status: Never   Smokeless tobacco: Never  Substance and Sexual Activity   Alcohol use: No   Drug use: No   Sexual activity: Yes    Partners: Male    Birth control/protection: Condom  Other Topics Concern   Not on file  Social History Narrative   Not on file   Social Drivers of Health   Financial Resource Strain: Not on file  Food Insecurity: Not on file  Transportation Needs: Not on file  Physical Activity: Not on file  Stress: Not on file  Social Connections: Not on file  Intimate Partner Violence: Not on file    FAMILY HISTORY: Family History  Problem Relation Age of Onset   Hypotension Mother    Hypertension Father    Healthy Brother    Healthy Son    Cancer Neg Hx    Diabetes Neg Hx  Heart disease Neg Hx    Stroke Neg Hx     ALLERGIES:  is allergic to azithromycin.  MEDICATIONS:  Current Outpatient Medications  Medication Sig Dispense Refill   B Complex Vitamins (VITAMIN B COMPLEX PO) Take by mouth.     FERROUS SULFATE PO Take by mouth.     Multiple Vitamin (MULTI-VITAMIN) tablet Take by mouth.     naproxen sodium (ANAPROX DS) 550 MG tablet Take 1 tablet (550 mg total) by mouth 2 (two) times daily with a meal. Take as needed for heavy cycles 30 tablet 2   VITAMIN D PO Take by mouth.     No current facility-administered medications for this  visit.    REVIEW OF SYSTEMS:   Constitutional: ( - ) fevers, ( - )  chills , ( - ) night sweats Eyes: ( - ) blurriness of vision, ( - ) double vision, ( - ) watery eyes Ears, nose, mouth, throat, and face: ( - ) mucositis, ( - ) sore throat Respiratory: ( - ) cough, ( - ) dyspnea, ( - ) wheezes Cardiovascular: ( - ) palpitation, ( - ) chest discomfort, ( - ) lower extremity swelling Gastrointestinal:  ( - ) nausea, ( - ) heartburn, ( - ) change in bowel habits Skin: ( - ) abnormal skin rashes Lymphatics: ( - ) new lymphadenopathy, ( - ) easy bruising Neurological: ( - ) numbness, ( - ) tingling, ( - ) new weaknesses Behavioral/Psych: ( - ) mood change, ( - ) new changes  All other systems were reviewed with the patient and are negative.  PHYSICAL EXAMINATION: Not performed due to telphone visit.   LABORATORY DATA:  I have reviewed the data as listed    Latest Ref Rng & Units 10/16/2023    9:08 AM 08/27/2023    9:25 AM 05/30/2023   10:34 AM  CBC  WBC 4.0 - 10.5 K/uL 6.3  6.4  7.5   Hemoglobin 12.0 - 15.0 g/dL 16.1  09.6  04.5   Hematocrit 36.0 - 46.0 % 39.9  39.3  41.5   Platelets 150 - 400 K/uL 309  323  262        Latest Ref Rng & Units 06/11/2022   10:23 AM 04/16/2022    4:13 PM 08/16/2021   11:46 AM  CMP  Glucose 70 - 99 mg/dL 409  97  80   BUN 6 - 20 mg/dL 10  11  11    Creatinine 0.44 - 1.00 mg/dL 8.11  9.14  7.82   Sodium 135 - 145 mmol/L 139  137  140   Potassium 3.5 - 5.1 mmol/L 4.2  3.9  4.7   Chloride 98 - 111 mmol/L 107  104  104   CO2 22 - 32 mmol/L 27  28  30    Calcium 8.9 - 10.3 mg/dL 8.9  9.0  9.5   Total Protein 6.5 - 8.1 g/dL 7.2  7.1  7.0   Total Bilirubin 0.3 - 1.2 mg/dL 0.3  0.2  0.3   Alkaline Phos 38 - 126 U/L 59  94    AST 15 - 41 U/L 15  19  19    ALT 0 - 44 U/L 10  16  15      RADIOGRAPHIC STUDIES: I have personally reviewed the radiological images as listed and agreed with the findings in the report. No results found.  ASSESSMENT &  PLAN Zamia IONIA SCHEY is a 48 y.o. female returns for a  follow up for iron deficiency anemia.   #Iron deficiency anemia 2/2 heavy menstrual bleeding/vegetarian diet: --Last received IV monoferric 1000 mg x 1 dose on 09/26/2023. --Labs from 10/06/2023 show no evidence of anemia with Hgb 13.1, MCV 86.7. Iron panel shows marked improvement with iron 150, saturation 43%, TIBC 350 and ferritin 322.  --No need for addition IV iron at this time,  --Patient wants to try OTC PO iron replacement 150 mg PO daily. Advised to start in 4 weeks and okay to take every other day if there is any intolerance.  --Continue to incorporate iron rich foods into her diet --Under the care of OB/GYN, Dr. Gertie Exon. --RTC in 3 months for lab check and 6 months for labs/follow up.   No orders of the defined types were placed in this encounter.   All questions were answered. The patient knows to call the clinic with any problems, questions or concerns.  I have spent a total of 25 minutes minutes of face-to-face and non-face-to-face time, preparing to see the patient,performing a medically appropriate examination, counseling and educating the patient, documenting clinical information in the electronic health record, and care coordination.   Georga Kaufmann PA-C Dept of Hematology and Oncology [facility] Phone: [phone]

## 2024-01-09 ENCOUNTER — Inpatient Hospital Stay: Payer: 59

## 2024-01-09 ENCOUNTER — Telehealth: Payer: Self-pay | Admitting: Physician Assistant

## 2024-01-09 NOTE — Telephone Encounter (Signed)
 Christine Mahoney called in to reschedule her lab appointment.

## 2024-01-29 ENCOUNTER — Other Ambulatory Visit (HOSPITAL_BASED_OUTPATIENT_CLINIC_OR_DEPARTMENT_OTHER): Payer: Self-pay

## 2024-01-29 MED ORDER — KETOROLAC TROMETHAMINE 0.5 % OP SOLN
1.0000 [drp] | Freq: Four times a day (QID) | OPHTHALMIC | 0 refills | Status: AC
  Filled 2024-01-29: qty 5, 25d supply, fill #0

## 2024-02-06 ENCOUNTER — Ambulatory Visit: Payer: Self-pay | Admitting: Physician Assistant

## 2024-02-06 ENCOUNTER — Inpatient Hospital Stay: Attending: Physician Assistant

## 2024-02-06 DIAGNOSIS — D500 Iron deficiency anemia secondary to blood loss (chronic): Secondary | ICD-10-CM

## 2024-02-06 DIAGNOSIS — N920 Excessive and frequent menstruation with regular cycle: Secondary | ICD-10-CM | POA: Insufficient documentation

## 2024-02-06 LAB — CBC WITH DIFFERENTIAL (CANCER CENTER ONLY)
Abs Immature Granulocytes: 0.01 10*3/uL (ref 0.00–0.07)
Basophils Absolute: 0.1 10*3/uL (ref 0.0–0.1)
Basophils Relative: 1 %
Eosinophils Absolute: 0.1 10*3/uL (ref 0.0–0.5)
Eosinophils Relative: 2 %
HCT: 39.4 % (ref 36.0–46.0)
Hemoglobin: 13.3 g/dL (ref 12.0–15.0)
Immature Granulocytes: 0 %
Lymphocytes Relative: 38 %
Lymphs Abs: 2.2 10*3/uL (ref 0.7–4.0)
MCH: 30 pg (ref 26.0–34.0)
MCHC: 33.8 g/dL (ref 30.0–36.0)
MCV: 88.7 fL (ref 80.0–100.0)
Monocytes Absolute: 0.4 10*3/uL (ref 0.1–1.0)
Monocytes Relative: 7 %
Neutro Abs: 3 10*3/uL (ref 1.7–7.7)
Neutrophils Relative %: 52 %
Platelet Count: 275 10*3/uL (ref 150–400)
RBC: 4.44 MIL/uL (ref 3.87–5.11)
RDW: 12.5 % (ref 11.5–15.5)
WBC Count: 5.8 10*3/uL (ref 4.0–10.5)
nRBC: 0 % (ref 0.0–0.2)

## 2024-02-06 LAB — IRON AND IRON BINDING CAPACITY (CC-WL,HP ONLY)
Iron: 88 ug/dL (ref 28–170)
Saturation Ratios: 25 % (ref 10.4–31.8)
TIBC: 356 ug/dL (ref 250–450)
UIBC: 268 ug/dL (ref 148–442)

## 2024-02-06 LAB — FERRITIN: Ferritin: 110 ng/mL (ref 11–307)

## 2024-04-08 ENCOUNTER — Inpatient Hospital Stay: Payer: 59

## 2024-04-09 ENCOUNTER — Inpatient Hospital Stay: Payer: 59 | Admitting: Physician Assistant

## 2024-04-12 DIAGNOSIS — M898X1 Other specified disorders of bone, shoulder: Secondary | ICD-10-CM | POA: Diagnosis not present

## 2024-05-12 ENCOUNTER — Inpatient Hospital Stay: Attending: Physician Assistant

## 2024-05-12 DIAGNOSIS — D500 Iron deficiency anemia secondary to blood loss (chronic): Secondary | ICD-10-CM | POA: Insufficient documentation

## 2024-05-12 DIAGNOSIS — N920 Excessive and frequent menstruation with regular cycle: Secondary | ICD-10-CM | POA: Diagnosis not present

## 2024-05-12 LAB — CBC WITH DIFFERENTIAL (CANCER CENTER ONLY)
Abs Immature Granulocytes: 0.01 K/uL (ref 0.00–0.07)
Basophils Absolute: 0.1 K/uL (ref 0.0–0.1)
Basophils Relative: 1 %
Eosinophils Absolute: 0.1 K/uL (ref 0.0–0.5)
Eosinophils Relative: 2 %
HCT: 40.3 % (ref 36.0–46.0)
Hemoglobin: 13.3 g/dL (ref 12.0–15.0)
Immature Granulocytes: 0 %
Lymphocytes Relative: 39 %
Lymphs Abs: 2.2 K/uL (ref 0.7–4.0)
MCH: 29.4 pg (ref 26.0–34.0)
MCHC: 33 g/dL (ref 30.0–36.0)
MCV: 89.2 fL (ref 80.0–100.0)
Monocytes Absolute: 0.4 K/uL (ref 0.1–1.0)
Monocytes Relative: 8 %
Neutro Abs: 2.8 K/uL (ref 1.7–7.7)
Neutrophils Relative %: 50 %
Platelet Count: 297 K/uL (ref 150–400)
RBC: 4.52 MIL/uL (ref 3.87–5.11)
RDW: 12.5 % (ref 11.5–15.5)
WBC Count: 5.6 K/uL (ref 4.0–10.5)
nRBC: 0 % (ref 0.0–0.2)

## 2024-05-12 LAB — IRON AND IRON BINDING CAPACITY (CC-WL,HP ONLY)
Iron: 51 ug/dL (ref 28–170)
Saturation Ratios: 14 % (ref 10.4–31.8)
TIBC: 370 ug/dL (ref 250–450)
UIBC: 319 ug/dL (ref 148–442)

## 2024-05-12 LAB — FERRITIN: Ferritin: 66 ng/mL (ref 11–307)

## 2024-05-14 ENCOUNTER — Inpatient Hospital Stay: Admitting: Physician Assistant

## 2024-05-14 DIAGNOSIS — D500 Iron deficiency anemia secondary to blood loss (chronic): Secondary | ICD-10-CM | POA: Diagnosis not present

## 2024-05-14 NOTE — Progress Notes (Signed)
 [facility] [phone]   [phone]  PROGRESS NOTE   I connected with Christine Mahoney  on 05/14/24 by telephone visit and verified that I am speaking with the correct person using two identifiers.   I discussed the limitations, risks, security and privacy concerns of performing an evaluation and management service by telemedicine and the availability of in-person appointments. I also discussed with the patient that there may be a patient responsible charge related to this service. The patient expressed understanding and agreed to proceed.  Patient's location: Home Provider's location: Office  Patient Care Team: Jodie Lavern CROME, MD as PCP - General (Family Medicine) Jannis Kate Norris, MD as Consulting Physician (Obstetrics and Gynecology)  Hematological/Oncological History 08/16/2021: WBC 6.6, Hgb 11.6, MCV 82.0, Plt 383, Ferritin 6. 04/16/2022: WBC 8.9, Hgb 11.7, MCV 84.3, Plt 347, Iron  18, Saturation 3.6%, Ferritin 4.4, TIBC 499.8 06/11/2022: Established care with [facility] Hematology with Dr. Norleen Kidney and Beryl Hornberger PA-C Received IV venofer  200 mg once a week x 5 doses from 06/21/2022-07/26/2022 Received IV venofer  200 mg once a week x3 doses from 01/03/2023-01/17/2023 Received IV monoferric  1000 mg x 1 dose on 09/26/2023  CHIEF COMPLAINTS/PURPOSE OF CONSULTATION:  Iron  deficiency anemia  HISTORY OF PRESENTING ILLNESS:  Christine Mahoney 48 y.o. female returns for a follow up for iron  deficiency anemia. She was last seen on 10/17/2023. In the interim, she continues on PO iron  therapy.   On exam today, Christine Mahoney reports she is feeling well with adequate energy levels. She reports her appetite and diet are unchanged. She continues to follow a vegetarian diet. She reports her menstrual cycles are unchanged with first 2-3 days are heavy. She denies any other signs of bleeding including hematochezia or melena. She is compliant with taking PO iron  therapy  daily. She denies fevers, chills, sweats, shortness of breath, chest pain or cough.  Rest of the 10 point ROS is below.   MEDICAL HISTORY:  Past Medical History:  Diagnosis Date   Fibroid     SURGICAL HISTORY: Past Surgical History:  Procedure Laterality Date   CESAREAN SECTION     2011 and 2013    SOCIAL HISTORY: Social History   Socioeconomic History   Marital status: Married    Spouse name: Not on file   Number of children: Not on file   Years of education: Not on file   Highest education level: Not on file  Occupational History   Not on file  Tobacco Use   Smoking status: Never   Smokeless tobacco: Never  Substance and Sexual Activity   Alcohol use: No   Drug use: No   Sexual activity: Yes    Partners: Male    Birth control/protection: Condom  Other Topics Concern   Not on file  Social History Narrative   Not on file   Social Drivers of Health   Financial Resource Strain: Not on file  Food Insecurity: Not on file  Transportation Needs: Not on file  Physical Activity: Not on file  Stress: Not on file  Social Connections: Not on file  Intimate Partner Violence: Not on file    FAMILY HISTORY: Family History  Problem Relation Age of Onset   Hypotension Mother    Hypertension Father    Healthy Brother    Healthy Son    Cancer Neg Hx    Diabetes Neg Hx    Heart disease Neg Hx    Stroke Neg Hx  ALLERGIES:  is allergic to azithromycin.  MEDICATIONS:  Current Outpatient Medications  Medication Sig Dispense Refill   B Complex Vitamins (VITAMIN B COMPLEX PO) Take by mouth.     FERROUS SULFATE PO Take by mouth.     Multiple Vitamin (MULTI-VITAMIN) tablet Take by mouth.     naproxen  sodium (ANAPROX  DS) 550 MG tablet Take 1 tablet (550 mg total) by mouth 2 (two) times daily with a meal. Take as needed for heavy cycles 30 tablet 2   VITAMIN D  PO Take by mouth.     No current facility-administered medications for this visit.    REVIEW OF SYSTEMS:    Constitutional: ( - ) fevers, ( - )  chills , ( - ) night sweats Eyes: ( - ) blurriness of vision, ( - ) double vision, ( - ) watery eyes Ears, nose, mouth, throat, and face: ( - ) mucositis, ( - ) sore throat Respiratory: ( - ) cough, ( - ) dyspnea, ( - ) wheezes Cardiovascular: ( - ) palpitation, ( - ) chest discomfort, ( - ) lower extremity swelling Gastrointestinal:  ( - ) nausea, ( - ) heartburn, ( - ) change in bowel habits Skin: ( - ) abnormal skin rashes Lymphatics: ( - ) new lymphadenopathy, ( - ) easy bruising Neurological: ( - ) numbness, ( - ) tingling, ( - ) new weaknesses Behavioral/Psych: ( - ) mood change, ( - ) new changes  All other systems were reviewed with the patient and are negative.  PHYSICAL EXAMINATION: Not performed due to telphone visit.   LABORATORY DATA:  I have reviewed the data as listed    Latest Ref Rng & Units 05/12/2024    9:15 AM 02/06/2024    9:21 AM 10/16/2023    9:08 AM  CBC  WBC 4.0 - 10.5 K/uL 5.6  5.8  6.3   Hemoglobin 12.0 - 15.0 g/dL 86.6  86.6  86.8   Hematocrit 36.0 - 46.0 % 40.3  39.4  39.9   Platelets 150 - 400 K/uL 297  275  309        Latest Ref Rng & Units 06/11/2022   10:23 AM 04/16/2022    4:13 PM 08/16/2021   11:46 AM  CMP  Glucose 70 - 99 mg/dL 899  97  80   BUN 6 - 20 mg/dL 10  11  11    Creatinine 0.44 - 1.00 mg/dL 9.36  9.30  9.43   Sodium 135 - 145 mmol/L 139  137  140   Potassium 3.5 - 5.1 mmol/L 4.2  3.9  4.7   Chloride 98 - 111 mmol/L 107  104  104   CO2 22 - 32 mmol/L 27  28  30    Calcium 8.9 - 10.3 mg/dL 8.9  9.0  9.5   Total Protein 6.5 - 8.1 g/dL 7.2  7.1  7.0   Total Bilirubin 0.3 - 1.2 mg/dL 0.3  0.2  0.3   Alkaline Phos 38 - 126 U/L 59  94    AST 15 - 41 U/L 15  19  19    ALT 0 - 44 U/L 10  16  15      RADIOGRAPHIC STUDIES: I have personally reviewed the radiological images as listed and agreed with the findings in the report. No results found.  ASSESSMENT & PLAN Christine Mahoney is a 48 y.o. female  returns for a follow up for iron  deficiency anemia.   #Iron  deficiency anemia 2/2 heavy  menstrual bleeding/vegetarian diet: --Last received IV monoferric  1000 mg x 1 dose on 09/26/2023. --Labs from 10/06/2023 show no evidence of anemia with Hgb 13.3, MCV 89.2. Iron  panel shows mild decrease but still in range with iron  51, ferritin 66, saturation 14% --No need for addition IV iron  at this time,  --Continue with PO iron  therapy daily --Continue to incorporate iron  rich foods into her diet --Under the care of OB/GYN, Dr. Jill Jertson. --Discussed GI evaluation to rule out malabsorption versus GI blood loss, patient requests to hold off on referral at this time.  --RTC in 3 months for lab check and 6 months for labs/follow up.   No orders of the defined types were placed in this encounter.   All questions were answered. The patient knows to call the clinic with any problems, questions or concerns.  I have spent a total of 25 minutes minutes of face-to-face and non-face-to-face time, preparing to see the patient, counseling and educating the patient, documenting clinical information in the electronic health record, and care coordination.   Johnston Police PA-C Dept of Hematology and Oncology [facility] Phone: [phone]

## 2024-07-14 DIAGNOSIS — M25561 Pain in right knee: Secondary | ICD-10-CM | POA: Diagnosis not present

## 2024-07-26 ENCOUNTER — Encounter: Payer: Self-pay | Admitting: Family Medicine

## 2024-07-26 ENCOUNTER — Ambulatory Visit (INDEPENDENT_AMBULATORY_CARE_PROVIDER_SITE_OTHER): Admitting: Family Medicine

## 2024-07-26 VITALS — BP 126/74 | HR 86 | Temp 98.1°F | Ht 63.0 in | Wt 151.2 lb

## 2024-07-26 DIAGNOSIS — M791 Myalgia, unspecified site: Secondary | ICD-10-CM

## 2024-07-26 DIAGNOSIS — D500 Iron deficiency anemia secondary to blood loss (chronic): Secondary | ICD-10-CM

## 2024-07-26 DIAGNOSIS — I8311 Varicose veins of right lower extremity with inflammation: Secondary | ICD-10-CM | POA: Diagnosis not present

## 2024-07-26 DIAGNOSIS — I8312 Varicose veins of left lower extremity with inflammation: Secondary | ICD-10-CM | POA: Diagnosis not present

## 2024-07-26 DIAGNOSIS — M25561 Pain in right knee: Secondary | ICD-10-CM | POA: Diagnosis not present

## 2024-07-26 LAB — COMPREHENSIVE METABOLIC PANEL WITH GFR
ALT: 14 U/L (ref 0–35)
AST: 14 U/L (ref 0–37)
Albumin: 4.3 g/dL (ref 3.5–5.2)
Alkaline Phosphatase: 59 U/L (ref 39–117)
BUN: 21 mg/dL (ref 6–23)
CO2: 24 meq/L (ref 19–32)
Calcium: 9.7 mg/dL (ref 8.4–10.5)
Chloride: 103 meq/L (ref 96–112)
Creatinine, Ser: 0.59 mg/dL (ref 0.40–1.20)
GFR: 106.76 mL/min (ref >60.00)
Glucose, Bld: 80 mg/dL (ref 70–99)
Potassium: 4.7 meq/L (ref 3.5–5.1)
Sodium: 136 meq/L (ref 135–145)
Total Bilirubin: 0.3 mg/dL (ref 0.2–1.2)
Total Protein: 6.5 g/dL (ref 6.0–8.3)

## 2024-07-26 LAB — CK: Total CK: 111 U/L (ref 17–177)

## 2024-07-26 LAB — B12 AND FOLATE PANEL
Folate: 23.6 ng/mL (ref >5.9)
Vitamin B-12: 448 pg/mL (ref 211–911)

## 2024-07-26 LAB — CBC WITH DIFFERENTIAL/PLATELET
Basophils Absolute: 0.1 K/uL (ref 0.0–0.1)
Basophils Relative: 0.9 % (ref 0.0–3.0)
Eosinophils Absolute: 0.1 K/uL (ref 0.0–0.7)
Eosinophils Relative: 1.4 % (ref 0.0–5.0)
HCT: 38.5 % (ref 36.0–46.0)
Hemoglobin: 12.9 g/dL (ref 12.0–15.0)
Lymphocytes Relative: 29.3 % (ref 12.0–46.0)
Lymphs Abs: 2.7 K/uL (ref 0.7–4.0)
MCHC: 33.5 g/dL (ref 30.0–36.0)
MCV: 89.5 fl (ref 78.0–100.0)
Monocytes Absolute: 0.6 K/uL (ref 0.1–1.0)
Monocytes Relative: 6.1 % (ref 3.0–12.0)
Neutro Abs: 5.8 K/uL (ref 1.4–7.7)
Neutrophils Relative %: 62.3 % (ref 43.0–77.0)
Platelets: 308 K/uL (ref 150.0–400.0)
RBC: 4.3 Mil/uL (ref 3.87–5.11)
RDW: 13.1 % (ref 11.5–15.5)
WBC: 9.4 K/uL (ref 4.0–10.5)

## 2024-07-26 LAB — SEDIMENTATION RATE: Sed Rate: 9 mm/h (ref 0–20)

## 2024-07-26 LAB — MAGNESIUM: Magnesium: 1.8 mg/dL (ref 1.5–2.5)

## 2024-07-26 LAB — VITAMIN B12: Vitamin B-12: 448 pg/mL (ref 211–911)

## 2024-07-26 LAB — TSH: TSH: 2.64 u[IU]/mL (ref 0.35–5.50)

## 2024-07-26 LAB — VITAMIN D 25 HYDROXY (VIT D DEFICIENCY, FRACTURES): VITD: 31.84 ng/mL (ref 30.00–100.00)

## 2024-07-26 MED ORDER — DICLOFENAC SODIUM 75 MG PO TBEC
75.0000 mg | DELAYED_RELEASE_TABLET | Freq: Two times a day (BID) | ORAL | 0 refills | Status: AC
Start: 2024-07-26 — End: ?

## 2024-07-26 NOTE — Patient Instructions (Addendum)
 Please return for your annual complete physical; please come fasting.   I will release your lab results to you on your MyChart account with further instructions. You may see the results before I do, but when I review them I will send you a message with my report or have my assistant call you if things need to be discussed. Please reply to my message with any questions. Thank you!   If you have any questions or concerns, please don't hesitate to send me a message via MyChart or call the office at [phone]. Thank you for visiting with us  today! It's our pleasure caring for you.    VISIT SUMMARY: Today, you were seen for multiple tender spots and joint pain that have been bothering you for the past few months. We discussed your symptoms, including the specific areas of tenderness and knee pain, and reviewed your current medications and nutritional status. A thorough examination was conducted, and a plan was made to address your concerns.  YOUR PLAN: -MUSCLE TENDERNESS AND JOINT PAIN: Chronic muscle tenderness and joint pain can be caused by various factors, including vitamin deficiencies, magnesium deficiency, and inflammatory conditions. We have ordered blood tests to check your vitamin levels, thyroid  function, and other markers. You have been prescribed Voltaren  (diclofenac ) to take twice daily as an alternative to meloxicam. Additionally, we recommend strength training to improve your overall joint health. Please follow up with an orthopedic specialist for your knee pain.  -VARICOSE VEINS WITH TENDERNESS: Varicose veins are enlarged veins that can be tender and cause discomfort. To help manage the tenderness, you can use ice packs for relief and take aspirin after exercise to reduce pain.  INSTRUCTIONS: Please complete the blood work as ordered. Follow up with an orthopedic specialist for your knee pain. Continue with strength training exercises and use ice packs and aspirin as needed for  varicose vein tenderness.                      Contains text generated by Abridge.                                 Contains text generated by Abridge.

## 2024-07-26 NOTE — Progress Notes (Signed)
 Subjective  CC:  Chief Complaint  Patient presents with   tender spots on legs    Tender spots on both legs for the past 3 mos and it has gotten worse.    HPI: Christine Mahoney is a 48 y.o. female who presents to the office today to address the problems listed above in the chief complaint. Discussed the use of AI scribe software for clinical note transcription with the patient, who gave verbal consent to proceed.  Pt last seen by me 2022. I reviewed chart since then. History of Present Illness Christine Mahoney is a 48 year old female who presents with multiple tender spots and joint pain.  Diffuse musculoskeletal tenderness and arthralgia - Multiple tender spots on legs and back for the past few months - Tenderness localized to legs, hamstrings, gluteal region, and shoulders, particularly with applied pressure - Tenderness does not cause discomfort unless pressure is applied - Tenderness also present in scapula and sacroiliac joints - Some varicose veins are tender - no red hot swollen or painful joints. - no rash or weekness - no systemic sxs - no injury - no leg edema  Knee pain - Knee pain for approximately two months being evaluated by [facility].  - No improvement with meloxicam 15 mg - Knee pain is distinct from previous clavicle pain, which had responded to meloxicam  Medication use and response - Currently taking meloxicam 15 mg without symptomatic relief for current musculoskeletal symptoms - Previously used magnesium but not currently taking it  Nutritional status and supplementation - Vegetarian diet - does not take  B12 supplements - Receives iron  infusions - No recent assessment of vitamin levels, including Vitamin D  and B12  Lab Results  Component Value Date   WBC 5.6 05/12/2024   HGB 13.3 05/12/2024   HCT 40.3 05/12/2024   MCV 89.2 05/12/2024   PLT 297 05/12/2024    Physical activity - Attends gym daily   Assessment  1. Diffuse muscle tenderness    2. Varicose veins of both lower extremities with inflammation   3. Iron  deficiency anemia due to chronic blood loss   4. Acute pain of right knee      Plan  Assessment and Plan Assessment & Plan Muscle tenderness and joint pain Chronic muscle tenderness and joint pain for several months, with tenderness over the hamstrings, scapula, SI joints, and glutes. Knee pain persists for two months. Differential diagnosis includes vitamin deficiencies (B12, D), magnesium deficiency, and inflammatory conditions vs other like fibromyalgia, autoimmune etc.  Examination is nonspecific, with no classic pattern for rheumatoid arthritis or other specific diseases. Meloxicam has been ineffective. - Ordered blood work: Vitamin D , B12, folate, CBC, thyroid , creatinine kinase, liver, kidney, magnesium, and sed rate. - Prescribed Voltaren  (diclofenac ) twice daily as an alternative anti-inflammatory. - Advised follow-up with orthopedic specialist for knee pain. - Recommended strength training for overall joint health.  Varicose veins with tenderness Tenderness over varicose veins, possibly contributing to leg discomfort. Examination reveals varicosities that may be tender upon palpation. - Advised use of ice packs for symptomatic relief. - Recommended aspirin for post-exercise pain management.  IDA: improved with iron  transfusions  Knee pain: to f/u with ortho. Trial of voltaren   Discussed recommendation for annual physical and preventive/welllness care    Follow up: for cpe Orders Placed This Encounter  Procedures   B12 and Folate Panel   CBC with Differential/Platelet   CK   Comprehensive metabolic panel with GFR   Sedimentation  rate   TSH   VITAMIN D  25 Hydroxy (Vit-D Deficiency, Fractures)   Vitamin B12   Magnesium   No orders of the defined types were placed in this encounter.    I reviewed the patients updated PMH, FH, and SocHx.  Patient Active Problem List   Diagnosis Date Noted    Iron  deficiency anemia due to chronic blood loss 06/11/2022   Plantar fasciitis, bilateral 02/23/2020   Current Meds  Medication Sig   B Complex Vitamins (VITAMIN B COMPLEX PO) Take by mouth.   FERROUS SULFATE PO Take by mouth.   Multiple Vitamin (MULTI-VITAMIN) tablet Take by mouth.   naproxen  sodium (ANAPROX  DS) 550 MG tablet Take 1 tablet (550 mg total) by mouth 2 (two) times daily with a meal. Take as needed for heavy cycles   VITAMIN D  PO Take by mouth.   Allergies: Patient is allergic to camphor and azithromycin. Family History: Patient family history includes Healthy in her brother and son; Hypertension in her father; Hypotension in her mother. Social History:  Patient  reports that she has never smoked. She has never used smokeless tobacco. She reports that she does not drink alcohol and does not use drugs.  Review of Systems: Constitutional: Negative for fever malaise or anorexia Cardiovascular: negative for chest pain Respiratory: negative for SOB or persistent cough Gastrointestinal: negative for abdominal pain  Objective  Vitals: BP 126/74   Pulse 86   Temp 98.1 F (36.7 C)   Ht 5' 3 (1.6 m)   Wt 151 lb 3.2 oz (68.6 kg)   SpO2 97%   BMI 26.78 kg/m  General: no acute distress , A&Ox3, appears well Legs: few tender varicosities bilaterally, multiple tender muscles spots on legs, low back. Right scapula.  Skin:  Warm, no rashes Commons side effects, risks, benefits, and alternatives for medications and treatment plan prescribed today were discussed, and the patient expressed understanding of the given instructions. Patient is instructed to call or message via MyChart if he/she has any questions or concerns regarding our treatment plan. No barriers to understanding were identified. We discussed Red Flag symptoms and signs in detail. Patient expressed understanding regarding what to do in case of urgent or emergency type symptoms.  Medication list was reconciled, printed  and provided to the patient in AVS. Patient instructions and summary information was reviewed with the patient as documented in the AVS. This note was prepared with assistance of Dragon voice recognition software. Occasional wrong-word or sound-a-like substitutions may have occurred due to the inherent limitations of voice recognition software

## 2024-07-27 ENCOUNTER — Ambulatory Visit: Payer: Self-pay | Admitting: Family Medicine

## 2024-07-27 NOTE — Progress Notes (Signed)
 See mychart note Dear Ms. Maree, Good news, all of your lab results are normal.  Your vitamin D  is low normal and you can consider increasing your daily supplement of that but I have no indications from this lab work that there is a problem causing your pain. Sincerely, Dr. Jodie

## 2024-08-06 ENCOUNTER — Other Ambulatory Visit: Payer: Self-pay

## 2024-08-06 ENCOUNTER — Inpatient Hospital Stay: Attending: Physician Assistant

## 2024-08-06 ENCOUNTER — Other Ambulatory Visit: Payer: Self-pay | Admitting: Physician Assistant

## 2024-08-06 ENCOUNTER — Telehealth: Payer: Self-pay

## 2024-08-06 DIAGNOSIS — D509 Iron deficiency anemia, unspecified: Secondary | ICD-10-CM | POA: Insufficient documentation

## 2024-08-06 DIAGNOSIS — D500 Iron deficiency anemia secondary to blood loss (chronic): Secondary | ICD-10-CM

## 2024-08-06 LAB — IRON AND TIBC
Iron: 67 ug/dL (ref 28–170)
Saturation Ratios: 18 % (ref 10.4–31.8)
TIBC: 379 ug/dL (ref 250–450)
UIBC: 312 ug/dL

## 2024-08-06 LAB — CBC WITH DIFFERENTIAL (CANCER CENTER ONLY)
Abs Immature Granulocytes: 0.01 K/uL (ref 0.00–0.07)
Basophils Absolute: 0.1 K/uL (ref 0.0–0.1)
Basophils Relative: 1 %
Eosinophils Absolute: 0.1 K/uL (ref 0.0–0.5)
Eosinophils Relative: 3 %
HCT: 39.2 % (ref 36.0–46.0)
Hemoglobin: 12.9 g/dL (ref 12.0–15.0)
Immature Granulocytes: 0 %
Lymphocytes Relative: 40 %
Lymphs Abs: 2 K/uL (ref 0.7–4.0)
MCH: 29.9 pg (ref 26.0–34.0)
MCHC: 32.9 g/dL (ref 30.0–36.0)
MCV: 90.7 fL (ref 80.0–100.0)
Monocytes Absolute: 0.3 K/uL (ref 0.1–1.0)
Monocytes Relative: 7 %
Neutro Abs: 2.4 K/uL (ref 1.7–7.7)
Neutrophils Relative %: 49 %
Platelet Count: 340 K/uL (ref 150–400)
RBC: 4.32 MIL/uL (ref 3.87–5.11)
RDW: 12.8 % (ref 11.5–15.5)
WBC Count: 5 K/uL (ref 4.0–10.5)
nRBC: 0 % (ref 0.0–0.2)

## 2024-08-06 LAB — CMP (CANCER CENTER ONLY)
ALT: 17 U/L (ref 0–44)
AST: 21 U/L (ref 15–41)
Albumin: 4.3 g/dL (ref 3.5–5.0)
Alkaline Phosphatase: 70 U/L (ref 38–126)
Anion gap: 8 (ref 5–15)
BUN: 17 mg/dL (ref 6–20)
CO2: 27 mmol/L (ref 22–32)
Calcium: 10.2 mg/dL (ref 8.9–10.3)
Chloride: 103 mmol/L (ref 98–111)
Creatinine: 0.59 mg/dL (ref 0.44–1.00)
GFR, Estimated: >60 mL/min (ref >60)
Glucose, Bld: 87 mg/dL (ref 70–99)
Potassium: 4.7 mmol/L (ref 3.5–5.1)
Sodium: 139 mmol/L (ref 135–145)
Total Bilirubin: 0.2 mg/dL (ref 0.0–1.2)
Total Protein: 7.2 g/dL (ref 6.5–8.1)

## 2024-08-06 LAB — FERRITIN: Ferritin: 53 ng/mL (ref 11–307)

## 2024-08-06 NOTE — Telephone Encounter (Signed)
 Pt LM asking if she needs to have labs drawn sooner than her Jan appt.  Her most recent Hgb drawn by her PCP dropped from 13.3 to 12.9 Please advise

## 2024-08-09 ENCOUNTER — Ambulatory Visit: Payer: Self-pay | Admitting: Physician Assistant

## 2024-08-18 ENCOUNTER — Ambulatory Visit (INDEPENDENT_AMBULATORY_CARE_PROVIDER_SITE_OTHER)

## 2024-08-18 ENCOUNTER — Ambulatory Visit (INDEPENDENT_AMBULATORY_CARE_PROVIDER_SITE_OTHER): Admitting: Orthopaedic Surgery

## 2024-08-18 DIAGNOSIS — M25561 Pain in right knee: Secondary | ICD-10-CM | POA: Diagnosis not present

## 2024-08-18 DIAGNOSIS — G8929 Other chronic pain: Secondary | ICD-10-CM

## 2024-08-18 DIAGNOSIS — M25569 Pain in unspecified knee: Secondary | ICD-10-CM | POA: Diagnosis not present

## 2024-08-18 NOTE — Progress Notes (Signed)
 Chief Complaint: Right knee pain     History of Present Illness:    Christine Mahoney is a 48 y.o. female presents today for ongoing right knee pain.  She has been having knee pain for 3 months where she did have a specific injury and felt a pop in the knee.  Since that time she has been having swelling and persistent medial based joint symptoms.  She has been trying home exercise strengthening program after going to [facility] as well as Mobic for 2 weeks which did not give her significant pain relief.  She has active but has been limited in playing sports as a result of the knee pain.    PMH/PSH/Family History/Social History/Meds/Allergies:    Past Medical History:  Diagnosis Date   Fibroid    Past Surgical History:  Procedure Laterality Date   CESAREAN SECTION     2011 and 2013   Social History   Socioeconomic History   Marital status: Married    Spouse name: Not on file   Number of children: Not on file   Years of education: Not on file   Highest education level: Not on file  Occupational History   Not on file  Tobacco Use   Smoking status: Never   Smokeless tobacco: Never  Substance and Sexual Activity   Alcohol use: No   Drug use: No   Sexual activity: Yes    Partners: Male    Birth control/protection: Condom  Other Topics Concern   Not on file  Social History Narrative   Not on file   Social Drivers of Health   Financial Resource Strain: Not on file  Food Insecurity: Not on file  Transportation Needs: Not on file  Physical Activity: Not on file  Stress: Not on file  Social Connections: Not on file   Family History  Problem Relation Age of Onset   Hypotension Mother    Hypertension Father    Healthy Brother    Healthy Son    Cancer Neg Hx    Diabetes Neg Hx    Heart disease Neg Hx    Stroke Neg Hx    Allergies  Allergen Reactions   Camphor Hives   Azithromycin Hives   Current Outpatient Medications  Medication Sig Dispense Refill    B Complex Vitamins (VITAMIN B COMPLEX PO) Take by mouth.     diclofenac  (VOLTAREN ) 75 MG EC tablet Take 1 tablet (75 mg total) by mouth 2 (two) times daily. 30 tablet 0   FERROUS SULFATE PO Take by mouth.     Multiple Vitamin (MULTI-VITAMIN) tablet Take by mouth.     VITAMIN D  PO Take by mouth.     No current facility-administered medications for this visit.   No results found.  Review of Systems:   A ROS was performed including pertinent positives and negatives as documented in the HPI.  Physical Exam :   Constitutional: NAD and appears stated age Neurological: Alert and oriented Psych: Appropriate affect and cooperative There were no vitals taken for this visit.   Comprehensive Musculoskeletal Exam:    Right knee with tenderness about the medial joint line with positive McMurray and difficulty placing weight on this.  Negative Lachman negative posterior drawer no varus or valgus laxity range of motion is from -3 degrees to 135 degrees   Imaging:   Xray (4 views right knee): Normal    I personally reviewed and interpreted the radiographs.   Assessment and Plan:  48 y.o. female with a right knee pain consistent with a possible meniscal root injury.  Given this I did discuss that I would like to obtain an MRI of the right knee to rule this out.  I will plan to see her back following MRI right knee and follow-up discuss results   I personally saw and evaluated the patient, and participated in the management and treatment plan.  Elspeth Parker, MD Attending Physician, Orthopedic Surgery  This document was dictated using Dragon voice recognition software. A reasonable attempt at proof reading has been made to minimize errors.

## 2024-08-19 ENCOUNTER — Telehealth: Payer: Self-pay | Admitting: Orthopaedic Surgery

## 2024-08-19 NOTE — Telephone Encounter (Signed)
 Patient called and said [facility] doesn't have the referral for MRI. CB#[phone]

## 2024-08-20 ENCOUNTER — Other Ambulatory Visit (HOSPITAL_BASED_OUTPATIENT_CLINIC_OR_DEPARTMENT_OTHER): Payer: Self-pay | Admitting: Orthopaedic Surgery

## 2024-08-20 DIAGNOSIS — G8929 Other chronic pain: Secondary | ICD-10-CM

## 2024-08-23 ENCOUNTER — Encounter (HOSPITAL_BASED_OUTPATIENT_CLINIC_OR_DEPARTMENT_OTHER): Payer: Self-pay | Admitting: Orthopaedic Surgery

## 2024-08-25 ENCOUNTER — Inpatient Hospital Stay: Admission: RE | Admit: 2024-08-25 | Discharge: 2024-08-25 | Attending: Orthopaedic Surgery

## 2024-08-25 DIAGNOSIS — G8929 Other chronic pain: Secondary | ICD-10-CM

## 2024-08-25 DIAGNOSIS — M222X1 Patellofemoral disorders, right knee: Secondary | ICD-10-CM | POA: Diagnosis not present

## 2024-08-31 NOTE — Telephone Encounter (Signed)
 PT ordered.

## 2024-08-31 NOTE — Addendum Note (Signed)
 Addended by: VANDERBILT LIONEL CROME on: 08/31/2024 04:01 PM   Modules accepted: Orders

## 2024-09-21 ENCOUNTER — Encounter: Payer: Self-pay | Admitting: Physical Therapy

## 2024-09-21 ENCOUNTER — Ambulatory Visit: Admitting: Physical Therapy

## 2024-09-21 DIAGNOSIS — M25561 Pain in right knee: Secondary | ICD-10-CM

## 2024-09-21 DIAGNOSIS — G8929 Other chronic pain: Secondary | ICD-10-CM | POA: Diagnosis not present

## 2024-09-21 NOTE — Therapy (Signed)
 " OUTPATIENT PHYSICAL THERAPY EVALUATION   Patient Name: Christine Mahoney MRN: [id number] DOB:1975/11/24, 49 y.o., female Today's Date: 09/21/2024  END OF SESSION:  PT End of Session - 09/21/24 0936     Visit Number 1    Number of Visits 6    Date for Recertification  11/02/24    Authorization Type [employer]na [place]    PT Start Time [phone]    PT Stop Time 1019    PT Time Calculation (min) 39 min    Activity Tolerance Patient tolerated treatment well    Behavior During Therapy [facility] for tasks assessed/performed          Past Medical History:  Diagnosis Date   Fibroid    Past Surgical History:  Procedure Laterality Date   CESAREAN SECTION     2011 and 2013   Patient Active Problem List   Diagnosis Date Noted   Iron  deficiency anemia due to chronic blood loss 06/11/2022   Plantar fasciitis, bilateral 02/23/2020    PCP: Jodie Lavern CROME, MD  REFERRING PROVIDER: Genelle Standing, MD  REFERRING DIAG: [phone] (ICD-10-CM) - Chronic pain of right knee  Rationale for Evaluation and Treatment: Rehabilitation  THERAPY DIAG:  Chronic pain of right knee - Plan: PT plan of care cert/re-cert  ONSET DATE: Sept 2025   SUBJECTIVE:                                                                                                                                                                                           SUBJECTIVE STATEMENT: Pt reports about a 3- 4 month history of Rt knee pain.  Pain is worse with crossing her legs, and after prolonged sitting.  She denies any specific injury.  She was going to the gym but has stopped recently.    PERTINENT HISTORY:  N/A  PAIN:  Are you having pain? Yes: NPRS scale: 0 currently, up to 7/10 Pain location: Rt knee, medial side Pain description: aching Aggravating factors: in figure -4 position, standing after sitting Relieving factors: repositioning and avoiding provoking positions  PRECAUTIONS:  None  RED  FLAGS: None   WEIGHT BEARING RESTRICTIONS:  No  FALLS:  Has patient fallen in last 6 months? No  LIVING ENVIRONMENT: Lives with: lives with their family (lives with 57 and 33 y/o children, husband) Lives in: House/apartment Stairs: has stairs inside/outside; some discomfort with descending   OCCUPATION:  Was working IT, recently resigned  PLOF:  Independent and Leisure: hiking, walking, playing, was mainly runner, broadcasting/film/video at gym when she went  PATIENT GOALS:  Improve pain   OBJECTIVE:  Note: Objective measures were  completed at Evaluation unless otherwise noted.  DIAGNOSTIC FINDINGS:  MRI: 1. No meniscal or ligamentous injury of the right knee. 2. Mild partial-thickness cartilage loss of the patellofemoral compartment.  COGNITIVE STATUS: Within functional limits for tasks assessed   SENSATION: WFL  POSTURE:  No Significant postural limitations  GAIT: 09/21/2024 Comments: mild antalgic gait   PALPATION: 09/21/2024 tenderness to palpation along Rt distal adductor/medial quad and along pes anserine bursa  EDEMA: 09/21/2024 mild swelling present along Rt patella   LOWER EXTREMITY ROM:     Bil knee ROM WNL; does reports some symptoms of tightness with end range flexion on Rt   LOWER EXTREMITY MMT:    MMT Right eval Left eval  Knee flexion    Knee extension 38.9# 34.45#   (Blank rows = not tested)    TREATMENT:                                                                                                                              DATE:  09/21/2024 TherEx See HEP - demonstrated with trial reps performed PRN, mod cues for comprehension    Self Care Educated on PT POC and clinical findings; discussed when/how to return to gym   Manual STM with compression to medial quad/adductors; skilled palpation and monitoring of soft tissue during DN  Trigger Point Dry Needling  Initial Treatment: Pt instructed on Dry Needling rational, procedures, and  possible side effects. Pt instructed to expect mild to moderate muscle soreness later in the day and/or into the next day.  Pt instructed in methods to reduce muscle soreness. Pt instructed to continue prescribed HEP. Patient was educated on signs and symptoms of infection and other risk factors and advised to seek medical attention should they occur.  Patient verbalized understanding of these instructions and education.   Patient Verbal Consent Given: Yes Education Handout Provided: Yes Muscles Treated: Rt medial quad/adductor Electrical Stimulation Performed: No Treatment Response/Outcome: twitch responses noted      PATIENT EDUCATION:  Education details: HEP, DN Person educated: Patient Education method: Programmer, Multimedia, Facilities Manager, and Handouts Education comprehension: verbalized understanding, returned demonstration, and needs further education  HOME EXERCISE PROGRAM: Access Code: [email or web address] URL: [email or web address] Date: 09/21/2024 Prepared by: Corean Ku  Exercises - Prone Quadriceps Stretch with Strap  - 2 x daily - 7 x weekly - 1 sets - 3 reps - 30 sec hold - Seated Figure 4 Piriformis Stretch  - 2 x daily - 7 x weekly - 1 sets - 3 reps - 30 sec hold - Side Lunge Adductor Stretch  - 2 x daily - 7 x weekly - 1 sets - 3 reps - 30 sec hold   ASSESSMENT:  CLINICAL IMPRESSION: Patient is a 49 y.o. female who was seen today for physical therapy evaluation and treatment for Rt knee pain. She demonstrates mild strength deficits and mild swelling with continued pain affecting functional mobility.  She will benefit from PT to address deficits  listed.     OBJECTIVE IMPAIRMENTS: Abnormal gait, decreased mobility, difficulty walking, decreased strength, increased fascial restrictions, increased muscle spasms, and pain.   ACTIVITY LIMITATIONS: sitting, standing, squatting, stairs, and locomotion level  PARTICIPATION LIMITATIONS: cleaning, laundry,  driving, shopping, community activity, and meditation  PERSONAL FACTORS: Past/current experiences and Time since onset of injury/illness/exacerbation are also affecting patient's functional outcome.   REHAB POTENTIAL: Good  CLINICAL DECISION MAKING: Stable/uncomplicated  EVALUATION COMPLEXITY: Moderate   GOALS: Goals reviewed with patient? Yes  SHORT TERM GOALS: Target date: 10/12/2024  Independent with initial HEP Goal status: INITIAL   LONG TERM GOALS: Target date: 11/02/2024  Independent with final HEP Goal status: INITIAL  2.  Report pain <3/10 with sit to stand for improved function Goal status: INITIAL  3.  Bil knee extension strength improved to 50# for improved function and mobility Goal status: INIITAL  4.  Report pain < 3/10 with descending stairs for improved function Goal status: INITIAL  5.  Report ability to meditate without increase in pain for improved mobility and function Goal status: INITIAL   PLAN:  PT FREQUENCY: 1x/week  PT DURATION: 6 weeks  PLANNED INTERVENTIONS: 97164- PT Re-evaluation, 97750- Physical Performance Testing, 97110-Therapeutic exercises, 97530- Therapeutic activity, 97112- Neuromuscular re-education, 97535- Self Care, 02859- Manual therapy, [phone]- Gait training, [phone]- Aquatic Therapy, [phone]- Electrical stimulation (unattended), 97016- Vasopneumatic device, [id number]- Ionotophoresis 4mg /ml Dexamethasone, 79439 (1-2 muscles), 20561 (3+ muscles)- Dry Needling, Patient/Family education, Stair training, Taping, Joint mobilization, Joint manipulation, Cryotherapy, and Moist heat.  PLAN FOR NEXT SESSION: Review HEP, assess response to DN, repeat, consider ionto if order signed, work in runner, broadcasting/film/video   NEXT MD VISIT: 10/02/23   Corean JULIANNA Ku, PT, DPT 09/21/2024 10:43 AM   "

## 2024-09-21 NOTE — Patient Instructions (Signed)

## 2024-09-27 ENCOUNTER — Telehealth: Payer: Self-pay | Admitting: Physician Assistant

## 2024-09-27 NOTE — Telephone Encounter (Signed)
 Pt called to reschedule appt.

## 2024-09-28 ENCOUNTER — Encounter: Payer: Self-pay | Admitting: Physical Therapy

## 2024-09-28 ENCOUNTER — Ambulatory Visit: Admitting: Physical Therapy

## 2024-09-28 DIAGNOSIS — G8929 Other chronic pain: Secondary | ICD-10-CM | POA: Diagnosis not present

## 2024-09-28 DIAGNOSIS — M25561 Pain in right knee: Secondary | ICD-10-CM

## 2024-09-28 NOTE — Therapy (Signed)
 " OUTPATIENT PHYSICAL THERAPY TREATMENT   Patient Name: Christine Mahoney MRN: [id number] DOB:[birth year], 49 y.o., female Today's Date: 09/28/2024  END OF SESSION:  PT End of Session - 09/28/24 0930     Visit Number 2    Number of Visits 6    Date for Recertification  11/02/24    Authorization Type Aetna [place]    PT Start Time 0930    PT Stop Time 1010    PT Time Calculation (min) 40 min    Activity Tolerance Patient tolerated treatment well    Behavior During Therapy [facility] for tasks assessed/performed           Past Medical History:  Diagnosis Date   Fibroid    Past Surgical History:  Procedure Laterality Date   CESAREAN SECTION     2011 and 2013   Patient Active Problem List   Diagnosis Date Noted   Iron  deficiency anemia due to chronic blood loss 06/11/2022   Plantar fasciitis, bilateral 02/23/2020    PCP: Jodie Lavern CROME, MD  REFERRING PROVIDER: Genelle Standing, MD  REFERRING DIAG: [phone] (ICD-10-CM) - Chronic pain of right knee  Rationale for Evaluation and Treatment: Rehabilitation  THERAPY DIAG:  Chronic pain of right knee  ONSET DATE: Sept 2025   SUBJECTIVE:                                                                                                                                                                                           SUBJECTIVE STATEMENT: Didn't feel like DN was overly beneficial.   PERTINENT HISTORY:  N/A  PAIN:  Are you having pain? Yes: NPRS scale: 0 currently, up to 7/10 Pain location: Rt knee, medial side Pain description: aching Aggravating factors: in figure -4 position, standing after sitting Relieving factors: repositioning and avoiding provoking positions  PRECAUTIONS:  None  RED FLAGS: None   WEIGHT BEARING RESTRICTIONS:  No  FALLS:  Has patient fallen in last 6 months? 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  DIAGNOSTIC FINDINGS:  MRI: 1. No meniscal or ligamentous injury of the right knee. 2. Mild partial-thickness cartilage loss of the patellofemoral compartment.  COGNITIVE STATUS: Within functional limits for tasks assessed  SENSATION: WFL  POSTURE:  No Significant postural limitations  GAIT: 09/21/2024 Comments: mild antalgic gait   PALPATION: 09/21/2024 tenderness to palpation along Rt distal adductor/medial quad and along pes anserine bursa  EDEMA: 09/21/2024 mild swelling present along Rt patella   LOWER EXTREMITY ROM:     Bil knee ROM WNL; does reports some symptoms of tightness with end range flexion on Rt   LOWER EXTREMITY MMT:    MMT Right eval Left eval  Knee flexion    Knee extension 38.9# 34.45#   (Blank rows = not tested)    TREATMENT:                                                                                                                              DATE:  09/28/24 Prone quad stretch 3x30 sec on Rt Deadlifts with 10# KB small range 2x10 -mod cues for technique Seated Rt LAQ with L3 band and ball squeeze 2x10  Type: Dexamethasone Location: Rt knee Dose: 1.0 cc Time: 4 hour patch  Educated on wear time and precautions     09/21/2024 TherEx See HEP - demonstrated with trial reps performed PRN, mod cues for comprehension    Self Care Educated on PT POC and clinical findings; discussed when/how to return to gym   Manual STM with compression to medial quad/adductors; skilled palpation and monitoring of soft tissue during DN  Trigger Point Dry Needling  Initial  Treatment: Pt instructed on Dry Needling rational, procedures, and possible side effects. Pt instructed to expect mild to moderate muscle soreness later in the day and/or into the next day.  Pt instructed in methods to reduce muscle soreness. Pt instructed to continue prescribed HEP. Patient was educated on signs and symptoms of infection and other risk factors and advised to seek medical attention should they occur.  Patient verbalized understanding of these instructions and education.   Patient Verbal Consent Given: Yes Education Handout Provided: Yes Muscles Treated: Rt medial quad/adductor Electrical Stimulation Performed: No Treatment Response/Outcome: twitch responses noted    PATIENT EDUCATION:  Education details: HEP, DN Person educated: Patient Education method: Programmer, Multimedia, Facilities Manager, and Handouts Education comprehension: verbalized understanding, returned demonstration, and needs further education  HOME EXERCISE PROGRAM: Access Code: [email or web address] URL: [email or web address] Date: 09/21/2024 Prepared by: Corean Ku  Exercises - Prone Quadriceps Stretch with Strap  - 2 x daily - 7 x weekly - 1 sets - 3 reps - 30 sec hold - Seated Figure 4 Piriformis Stretch  - 2 x daily - 7 x weekly - 1 sets - 3 reps - 30 sec hold - Side Lunge Adductor Stretch  - 2 x daily - 7 x weekly - 1 sets - 3 reps - 30 sec hold   ASSESSMENT:  CLINICAL IMPRESSION: Pt without significant change from DN after last session.  Did work on some additional strengthening and encouraged to return to gym and switching up exercises for variety.  Trial of ionto today.  OBJECTIVE IMPAIRMENTS: Abnormal gait, decreased mobility, difficulty walking, decreased strength, increased fascial restrictions, increased muscle spasms, and pain.   ACTIVITY LIMITATIONS: sitting, standing, squatting, stairs, and locomotion level  PARTICIPATION LIMITATIONS: cleaning, laundry, driving, shopping,  community activity, and meditation  PERSONAL FACTORS: Past/current experiences and Time since onset of injury/illness/exacerbation are also affecting patient's functional outcome.   REHAB POTENTIAL: Good  CLINICAL DECISION MAKING: Stable/uncomplicated  EVALUATION COMPLEXITY: Moderate   GOALS: Goals reviewed with patient? Yes  SHORT TERM GOALS: Target date: 10/12/2024  Independent with initial HEP Goal status: INITIAL   LONG TERM GOALS: Target date: 11/02/2024  Independent with final HEP Goal status: INITIAL  2.  Report pain <3/10 with sit to stand for improved function Goal status: INITIAL  3.  Bil knee extension strength improved to 50# for improved function and mobility Goal status: INIITAL  4.  Report pain < 3/10 with descending stairs for improved function Goal status: INITIAL  5.  Report ability to meditate without increase in pain for improved mobility and function Goal status: INITIAL   PLAN:  PT FREQUENCY: 1x/week  PT DURATION: 6 weeks  PLANNED INTERVENTIONS: 97164- PT Re-evaluation, 97750- Physical Performance Testing, 97110-Therapeutic exercises, 97530- Therapeutic activity, 97112- Neuromuscular re-education, 97535- Self Care, 02859- Manual therapy, [phone]- Gait training, [phone]- Aquatic Therapy, [phone]- Electrical stimulation (unattended), 97016- Vasopneumatic device, [id number]- Ionotophoresis 4mg /ml Dexamethasone, 79439 (1-2 muscles), 20561 (3+ muscles)- Dry Needling, Patient/Family education, Stair training, Taping, Joint mobilization, Joint manipulation, Cryotherapy, and Moist heat.  PLAN FOR NEXT SESSION: Review HEP PRN, assess response to ionto,  repeat #2, consider ionto if order signed, work in runner, broadcasting/film/video   NEXT MD VISIT: 10/02/23   Corean JULIANNA Ku, PT, DPT 09/28/2024 10:37 AM   "

## 2024-09-28 NOTE — Patient Instructions (Signed)

## 2024-09-29 ENCOUNTER — Inpatient Hospital Stay

## 2024-10-01 ENCOUNTER — Ambulatory Visit (HOSPITAL_BASED_OUTPATIENT_CLINIC_OR_DEPARTMENT_OTHER): Admitting: Orthopaedic Surgery

## 2024-10-01 ENCOUNTER — Inpatient Hospital Stay: Admitting: Physician Assistant

## 2024-10-01 DIAGNOSIS — G8929 Other chronic pain: Secondary | ICD-10-CM | POA: Diagnosis not present

## 2024-10-01 DIAGNOSIS — M25561 Pain in right knee: Secondary | ICD-10-CM

## 2024-10-01 NOTE — Addendum Note (Signed)
 Addended by: WOLFGANG CONLEY HERO on: 10/01/2024 12:11 PM   Modules accepted: Orders

## 2024-10-01 NOTE — Progress Notes (Signed)
 "   Chief Complaint: Right knee pain     History of Present Illness: 10/01/2024  Presents today for MRI discussion of the right knee  Christine Mahoney is a 49 y.o. female presents today for ongoing right knee pain.  She has been having knee pain for 3 months where she did have a specific injury and felt a pop in the knee.  Since that time she has been having swelling and persistent medial based joint symptoms.  She has been trying home exercise strengthening program after going to [facility] as well as Mobic for 2 weeks which did not give her significant pain relief.  She has active but has been limited in playing sports as a result of the knee pain.    PMH/PSH/Family History/Social History/Meds/Allergies:    Past Medical History:  Diagnosis Date   Fibroid    Past Surgical History:  Procedure Laterality Date   CESAREAN SECTION     2011 and 2013   Social History   Socioeconomic History   Marital status: Married    Spouse name: Not on file   Number of children: Not on file   Years of education: Not on file   Highest education level: Not on file  Occupational History   Not on file  Tobacco Use   Smoking status: Never   Smokeless tobacco: Never  Substance and Sexual Activity   Alcohol use: No   Drug use: No   Sexual activity: Yes    Partners: Male    Birth control/protection: Condom  Other Topics Concern   Not on file  Social History Narrative   Not on file   Social Drivers of Health   Tobacco Use: Low Risk (09/28/2024)   Patient History    Smoking Tobacco Use: Never    Smokeless Tobacco Use: Never    Passive Exposure: Not on file  Financial Resource Strain: Not on file  Food Insecurity: Not on file  Transportation Needs: Not on file  Physical Activity: Not on file  Stress: Not on file  Social Connections: Not on file  Depression (PHQ2-9): Low Risk (07/26/2024)   Depression (PHQ2-9)    PHQ-2 Score: 0  Alcohol Screen: Not on file  Housing: Not on file   Utilities: Not on file  Health Literacy: Not on file   Family History  Problem Relation Age of Onset   Hypotension Mother    Hypertension Father    Healthy Brother    Healthy Son    Cancer Neg Hx    Diabetes Neg Hx    Heart disease Neg Hx    Stroke Neg Hx    Allergies  Allergen Reactions   Camphor Hives   Azithromycin Hives   Current Outpatient Medications  Medication Sig Dispense Refill   B Complex Vitamins (VITAMIN B COMPLEX PO) Take by mouth.     diclofenac  (VOLTAREN ) 75 MG EC tablet Take 1 tablet (75 mg total) by mouth 2 (two) times daily. 30 tablet 0   FERROUS SULFATE PO Take by mouth.     Multiple Vitamin (MULTI-VITAMIN) tablet Take by mouth.     VITAMIN D  PO Take by mouth.     No current facility-administered medications for this visit.   No results found.  Review of Systems:   A ROS was performed including pertinent positives and negatives as documented in the HPI.  Physical Exam :   Constitutional: NAD and appears stated age Neurological: Alert and oriented Psych: Appropriate affect and cooperative There  were no vitals taken for this visit.   Comprehensive Musculoskeletal Exam:    Right knee with tenderness about the medial joint line with positive McMurray and difficulty placing weight on this.  Negative Lachman negative posterior drawer no varus or valgus laxity range of motion is from -3 degrees to 135 degrees   Imaging:   Xray (4 views right knee): Normal  MRI right knee: There is central full-thickness patella as well as central trochlear chondral loss  I personally reviewed and interpreted the radiographs.   Assessment and Plan:   49 y.o. female with a right knee pain and swelling consistent with patellofemoral chondral loss.  At this time I did discuss that I would recommend physical therapy for strengthening about the right leg and hip in order to compensate for this.  I will plan to see her back in 6 weeks.  We did briefly discuss the  possibility of a diagnostic arthroscopy with Maci biopsy.  We will plan to begin with physical therapy first   I personally saw and evaluated the patient, and participated in the management and treatment plan.  Elspeth Parker, MD Attending Physician, Orthopedic Surgery  This document was dictated using Dragon voice recognition software. A reasonable attempt at proof reading has been made to minimize errors. "

## 2024-10-05 ENCOUNTER — Encounter: Admitting: Physical Therapy

## 2024-10-12 ENCOUNTER — Encounter: Admitting: Physical Therapy

## 2024-11-11 ENCOUNTER — Ambulatory Visit (HOSPITAL_BASED_OUTPATIENT_CLINIC_OR_DEPARTMENT_OTHER): Admitting: Orthopaedic Surgery

## 2024-11-16 ENCOUNTER — Inpatient Hospital Stay: Attending: Physician Assistant

## 2024-11-19 ENCOUNTER — Inpatient Hospital Stay: Admitting: Physician Assistant
